# Patient Record
Sex: Female | Born: 1955 | Race: White | Hispanic: No | Marital: Married | Smoking: Never smoker
Health system: Southern US, Community
[De-identification: ages and names within clinical notes are randomized; demographics above are authoritative.]

## PROBLEM LIST (undated history)

## (undated) DIAGNOSIS — T783XXA Angioneurotic edema, initial encounter: Secondary | ICD-10-CM

## (undated) DIAGNOSIS — R51 Headache: Secondary | ICD-10-CM

## (undated) DIAGNOSIS — C801 Malignant (primary) neoplasm, unspecified: Secondary | ICD-10-CM

## (undated) DIAGNOSIS — K5900 Constipation, unspecified: Secondary | ICD-10-CM

## (undated) DIAGNOSIS — R1310 Dysphagia, unspecified: Secondary | ICD-10-CM

## (undated) HISTORY — DX: Angioneurotic edema, initial encounter: T78.3XXA

## (undated) HISTORY — PX: DOPPLER ECHOCARDIOGRAPHY: SHX263

## (undated) HISTORY — DX: Dysphagia, unspecified: R13.10

## (undated) HISTORY — DX: Constipation, unspecified: K59.00

## (undated) HISTORY — PX: COLONOSCOPY: SHX174

---

## 1986-02-17 HISTORY — PX: MASTECTOMY, MODIFIED RADICAL W/RECONSTRUCTION: SHX708

## 1997-02-17 HISTORY — PX: AREOLA/NIPPLE RECONSTRUCTION WITH GRAFT: SHX5586

## 1998-08-28 ENCOUNTER — Other Ambulatory Visit: Admission: RE | Admit: 1998-08-28 | Discharge: 1998-08-28 | Payer: Self-pay | Admitting: Internal Medicine

## 1999-08-28 ENCOUNTER — Other Ambulatory Visit: Admission: RE | Admit: 1999-08-28 | Discharge: 1999-08-28 | Payer: Self-pay | Admitting: Internal Medicine

## 2006-05-01 ENCOUNTER — Encounter: Admission: RE | Admit: 2006-05-01 | Discharge: 2006-05-01 | Payer: Self-pay | Admitting: Internal Medicine

## 2007-05-04 ENCOUNTER — Ambulatory Visit (HOSPITAL_COMMUNITY): Admission: RE | Admit: 2007-05-04 | Discharge: 2007-05-04 | Payer: Self-pay | Admitting: Internal Medicine

## 2007-05-04 ENCOUNTER — Encounter: Payer: Self-pay | Admitting: Internal Medicine

## 2007-05-07 ENCOUNTER — Encounter: Payer: Self-pay | Admitting: Internal Medicine

## 2007-08-12 ENCOUNTER — Ambulatory Visit: Payer: Self-pay | Admitting: Internal Medicine

## 2007-08-12 DIAGNOSIS — J45991 Cough variant asthma: Secondary | ICD-10-CM | POA: Insufficient documentation

## 2007-08-12 DIAGNOSIS — J310 Chronic rhinitis: Secondary | ICD-10-CM | POA: Insufficient documentation

## 2007-08-12 DIAGNOSIS — Z853 Personal history of malignant neoplasm of breast: Secondary | ICD-10-CM

## 2010-11-27 ENCOUNTER — Emergency Department (HOSPITAL_COMMUNITY)
Admission: EM | Admit: 2010-11-27 | Discharge: 2010-11-27 | Disposition: A | Discharge status: Home / Self Care | Payer: Managed Care, Other (non HMO) | Attending: Emergency Medicine | Admitting: Emergency Medicine

## 2010-11-27 ENCOUNTER — Emergency Department (HOSPITAL_COMMUNITY): Payer: Managed Care, Other (non HMO)

## 2010-11-27 DIAGNOSIS — Z853 Personal history of malignant neoplasm of breast: Secondary | ICD-10-CM | POA: Insufficient documentation

## 2010-11-27 DIAGNOSIS — R110 Nausea: Secondary | ICD-10-CM | POA: Insufficient documentation

## 2010-11-27 DIAGNOSIS — R079 Chest pain, unspecified: Secondary | ICD-10-CM | POA: Insufficient documentation

## 2010-11-27 DIAGNOSIS — Z8249 Family history of ischemic heart disease and other diseases of the circulatory system: Secondary | ICD-10-CM | POA: Insufficient documentation

## 2010-11-27 LAB — CBC
HCT: 40.8 % (ref 36.0–46.0)
Hemoglobin: 13.7 g/dL (ref 12.0–15.0)
MCH: 31.9 pg (ref 26.0–34.0)
MCHC: 33.6 g/dL (ref 30.0–36.0)
RBC: 4.3 MIL/uL (ref 3.87–5.11)

## 2010-11-27 LAB — BASIC METABOLIC PANEL
BUN: 7 mg/dL (ref 6–23)
CO2: 25 mEq/L (ref 19–32)
Calcium: 9.7 mg/dL (ref 8.4–10.5)
Glucose, Bld: 99 mg/dL (ref 70–99)

## 2010-11-27 LAB — DIFFERENTIAL
Lymphocytes Relative: 13 % (ref 12–46)
Lymphs Abs: 1 10*3/uL (ref 0.7–4.0)
Monocytes Absolute: 0.7 10*3/uL (ref 0.1–1.0)
Monocytes Relative: 9 % (ref 3–12)
Neutro Abs: 5.8 10*3/uL (ref 1.7–7.7)

## 2010-11-27 LAB — POCT I-STAT TROPONIN I

## 2010-12-06 ENCOUNTER — Observation Stay (HOSPITAL_COMMUNITY)
Admission: EM | Admit: 2010-12-06 | Discharge: 2010-12-07 | Disposition: A | Discharge status: Home / Self Care | Payer: Managed Care, Other (non HMO) | Attending: Internal Medicine | Admitting: Internal Medicine

## 2010-12-06 ENCOUNTER — Emergency Department (HOSPITAL_COMMUNITY): Payer: Managed Care, Other (non HMO)

## 2010-12-06 DIAGNOSIS — Z8249 Family history of ischemic heart disease and other diseases of the circulatory system: Secondary | ICD-10-CM | POA: Insufficient documentation

## 2010-12-06 DIAGNOSIS — Z23 Encounter for immunization: Secondary | ICD-10-CM | POA: Insufficient documentation

## 2010-12-06 DIAGNOSIS — R079 Chest pain, unspecified: Principal | ICD-10-CM | POA: Insufficient documentation

## 2010-12-06 DIAGNOSIS — Z853 Personal history of malignant neoplasm of breast: Secondary | ICD-10-CM | POA: Insufficient documentation

## 2010-12-06 LAB — DIFFERENTIAL
Basophils Absolute: 0 10*3/uL (ref 0.0–0.1)
Basophils Relative: 0 % (ref 0–1)
Lymphocytes Relative: 17 % (ref 12–46)
Monocytes Absolute: 0.8 10*3/uL (ref 0.1–1.0)
Neutro Abs: 4.9 10*3/uL (ref 1.7–7.7)
Neutrophils Relative %: 70 % (ref 43–77)

## 2010-12-06 LAB — PROTIME-INR
INR: 0.91 (ref 0.00–1.49)
Prothrombin Time: 12.4 seconds (ref 11.6–15.2)

## 2010-12-06 LAB — COMPREHENSIVE METABOLIC PANEL
Albumin: 4.1 g/dL (ref 3.5–5.2)
Alkaline Phosphatase: 82 U/L (ref 39–117)
BUN: 5 mg/dL — ABNORMAL LOW (ref 6–23)
Calcium: 10.3 mg/dL (ref 8.4–10.5)
Creatinine, Ser: 0.43 mg/dL — ABNORMAL LOW (ref 0.50–1.10)
GFR calc Af Amer: >90 mL/min (ref >90)
Glucose, Bld: 95 mg/dL (ref 70–99)
Total Protein: 7.7 g/dL (ref 6.0–8.3)

## 2010-12-06 LAB — URINALYSIS, ROUTINE W REFLEX MICROSCOPIC
Bilirubin Urine: NEGATIVE
Nitrite: NEGATIVE
Specific Gravity, Urine: 1.01 (ref 1.005–1.030)
Urobilinogen, UA: 0.2 mg/dL (ref 0.0–1.0)
pH: 6.5 (ref 5.0–8.0)

## 2010-12-06 LAB — APTT: aPTT: 29 seconds (ref 24–37)

## 2010-12-06 LAB — CBC
HCT: 41.6 % (ref 36.0–46.0)
Hemoglobin: 14.4 g/dL (ref 12.0–15.0)
MCHC: 34.6 g/dL (ref 30.0–36.0)
RBC: 4.39 MIL/uL (ref 3.87–5.11)

## 2010-12-06 LAB — POCT I-STAT, CHEM 8
BUN: 3 mg/dL — ABNORMAL LOW (ref 6–23)
Calcium, Ion: 1.2 mmol/L (ref 1.12–1.32)
Chloride: 100 mEq/L (ref 96–112)
Glucose, Bld: 99 mg/dL (ref 70–99)
HCT: 45 % (ref 36.0–46.0)
Potassium: 3.9 mEq/L (ref 3.5–5.1)

## 2010-12-06 LAB — CK TOTAL AND CKMB (NOT AT ARMC)
CK, MB: 1.5 ng/mL (ref 0.3–4.0)
Relative Index: INVALID (ref 0.0–2.5)
Total CK: 67 U/L (ref 7–177)

## 2010-12-06 LAB — HEMOGLOBIN A1C: Hgb A1c MFr Bld: 5.2 % (ref <5.7)

## 2010-12-06 LAB — URINE MICROSCOPIC-ADD ON

## 2010-12-06 LAB — POCT I-STAT TROPONIN I: Troponin i, poc: 0 ng/mL (ref 0.00–0.08)

## 2010-12-06 LAB — MAGNESIUM: Magnesium: 1.7 mg/dL (ref 1.5–2.5)

## 2010-12-07 ENCOUNTER — Observation Stay (HOSPITAL_COMMUNITY): Payer: Managed Care, Other (non HMO)

## 2010-12-07 HISTORY — PX: NM MYOCAR MULTIPLE W/SPECT: HXRAD626

## 2010-12-07 LAB — CBC
MCH: 31.7 pg (ref 26.0–34.0)
MCV: 95.9 fL (ref 78.0–100.0)
Platelets: 222 10*3/uL (ref 150–400)
RBC: 3.88 MIL/uL (ref 3.87–5.11)
RDW: 12.5 % (ref 11.5–15.5)
WBC: 5.7 10*3/uL (ref 4.0–10.5)

## 2010-12-07 LAB — CARDIAC PANEL(CRET KIN+CKTOT+MB+TROPI)
CK, MB: 1.5 ng/mL (ref 0.3–4.0)
Relative Index: INVALID (ref 0.0–2.5)
Total CK: 46 U/L (ref 7–177)
Total CK: 40 U/L (ref 7–177)
Troponin I: <0.3 ng/mL (ref <0.30)

## 2010-12-07 LAB — LIPID PANEL
HDL: 74 mg/dL (ref >39)
LDL Cholesterol: 81 mg/dL (ref 0–99)
Total CHOL/HDL Ratio: 2.2 RATIO

## 2010-12-07 MED ORDER — TECHNETIUM TC 99M TETROFOSMIN IV KIT
30.0000 | PACK | Freq: Once | INTRAVENOUS | Status: AC | PRN
  Administered 2010-12-07: 30 via INTRAVENOUS

## 2010-12-07 MED ORDER — TECHNETIUM TC 99M TETROFOSMIN IV KIT
10.0000 | PACK | Freq: Once | INTRAVENOUS | Status: AC | PRN
  Administered 2010-12-07: 10 via INTRAVENOUS

## 2010-12-10 NOTE — Discharge Summary (Signed)
Emily Holder, Emily Holder               ACCOUNT NO.:  [id number]  MEDICAL RECORD NO.:  [id number]  LOCATION:  2021                         FACILITY:  [facility]  PHYSICIAN:  Italy Hilty, MD         DATE OF BIRTH:  01-06-1956  DATE OF ADMISSION:  12/06/2010 DATE OF DISCHARGE:  12/07/2010                              DISCHARGE SUMMARY   DISCHARGE DIAGNOSES: 1. Chest pain, negative myocardial infarction.     a.     Negative Lexiscan Myoview.     b.     Suspect acute pericarditis. 2. Premature family history of coronary artery disease. 3. History of breast cancer with reconstruction 24 years ago.  DISCHARGE CONDITION:  Improved.  PROCEDURES:  None.  DISCHARGE MEDICATIONS:  See medication reconciliation sheet, though we did add ibuprofen 600 mg t.i.d. for 3 days and then as needed, also we placed her on Protonix 40 mg daily in addition to her previous medicines.  DISCHARGE INSTRUCTIONS:  She will follow up with Dr. Rennis Golden in 1-2 weeks, the office will call with date and time.  HOSPITAL COURSE:  A 55 year old white married female who presented to [facility] Emergency Room secondary to chest pain at the request of her primary physician, Dr. Felipa Eth.  The patient had previously been seen in the emergency room a week prior, and cardiac enzymes were negative.  The pain had gradually resolved during the ER visit.  She was discharged, but then the pain returned on the day of admission.  She was going to be seen in our office, but the pain continued, it was significant, she came to the emergency room for further evaluation.  I kept her awake all night.  She cannot lie flat.  She would have to sit up and it would increase in discomfort with a deep breath.  Her EKG revealed no acute changes and she was admitted to telemetry bed for further evaluation. Please see dictated H and P for complete details.  Overnight with Toradol and Protonix, her pain resolved and she had no further chest pain.  On the  next morning, she underwent a nuclear stress test. Lexiscan Myoview which was negative for ischemia and an EF was 68%.  Dr. Royann Shivers, saw her the day of discharge, cardiac enzymes were negative and her EKG had changed.  She had new ST elevation in leads II, III, and aVF.  There was a one time EKG when she came down for the nuclear stress test, the EKG was similar to the admission EKG.  A 2D echo was also done.  EF was 60-65%.  There was no regional wall motion abnormalities.  She had some mild left ventricular abnormal relaxation with grade 1 diastolic dysfunction, a trivial pericardial effusion was identified, but no signs of tamponade because the echo was normal and the patient had no further chest pain, and negative nuclear stress test.  She was discharged home and will follow up as an outpatient.  LABORATORY DATA:  Hemoglobin at discharge 12.3, hematocrit 37.2, WBC 5.7, platelets 222.  On admission, neutrophils were 17, mono 11, and eosinophils 2.  Protime on admission was 12.4 with INR 0.91, PTT 29.  Chemistry,  sodium 139, potassium 3.9, chloride 100, CO2 of 28, glucose 99, BUN 3, creatinine 0.43, total protein 7.7, albumin 4.1, AST 33, ALT 17, alkaline phos 82, total bili 0.7, magnesium 1.7.  Glycohemoglobin was 12.2.  Cardiac enzymes were negative with CKs ranging 67-40, MBs 1.5 x3 and troponin I less than 0.30 x3.  Total cholesterol 164, triglycerides 45, HDL 74.  LDL was 81.  TSH 1.894.  UA did have leukocyte esterase.  Chest x-ray was done.  No acute cardiopulmonary findings.  The patient was discharged home as instructed and will follow up as an outpatient.     Darcella Gasman. Annie Paras, N.P.   ______________________________ [place] Hilty, MD    LRI/MEDQ  D:  12/08/2010  T:  12/09/2010  Job:  [id number]  cc:   Larina Earthly, M.D.  Electronically Signed by Nada Boozer N.P. on 12/09/2010 01:33:41 PM Electronically Signed by Kirtland Bouchard. HILTY M.D. on 12/10/2010 08:23:06 AM

## 2010-12-10 NOTE — H&P (Signed)
Emily Holder, Emily Holder               ACCOUNT NO.:  [id number]  MEDICAL RECORD NO.:  [id number]  LOCATION:  [facility]                         FACILITY:  [facility]  PHYSICIAN:  Italy Hilty, MD         DATE OF BIRTH:  1955/12/20  DATE OF ADMISSION:  12/06/2010 DATE OF DISCHARGE:                             HISTORY & PHYSICAL   CHIEF COMPLAINT:  Chest pain.  HISTORY OF PRESENT ILLNESS:  A 55 year old white married female with premature family history of coronary artery disease in her family history.  Her father died at 32 with an MI.  He also had rheumatoid arthritis.  The patient has had now 2 ER visits within the last 10 days. Initially on November 27, 2010, she developed left arm aching that eventually settled into her chest.  It lasted for awhile, resolved, and then came back stronger, so she went to the Emergency Room at [facility].  There she was in the ER for 5 hours.  EKG was stable.  Troponin-I was done once every 3 hours, which was negative.  Other labs were normal.  Chest x-ray was without acute process and she actually started feeling better.  She has done fairly well until yesterday.  Last night, she started having left arm aching again, then it gradually went into her chest, she took some Tylenol PM and tried to go to sleep, unable to rest, she took an aspirin, the discomfort eased some, but it did keep her awake most of the night.  This morning at 6, she was more concerned, it is increased with a deep breath.  She called Dr. Felipa Eth, her primary care, who was going to have her seen at [facility] with Dr. Tresa Endo, but her pain continued and therefore she was asked by Dr. Felipa Eth to come to the emergency room where we are evaluating her.  Currently, she still has discomfort 2-3 on a 1-10 scale and aching discomfort in her left arm, in her chest.  She has not done any exertional exercise that would bring this on.  She does have a history of left mastectomy with reconstruction surgery.   EKG here in the ER is sinus rhythm with no changes from November 27, 2010.  PAST MEDICAL HISTORY:  Breast cancer with left mastectomy and reconstruction 24 years ago.  History of migraines, but they are not very frequent since she has medication to take.  Never used tobacco.  FAMILY HISTORY:  Father died with an MI at age 39.  He also had rheumatoid arthritis, was wheelchair bound.  Mother died with pneumonia, she was diabetic, she has 2 brothers.  No known coronary artery disease that she is aware of.  OUTPATIENT MEDICATIONS:  None except p.r.n. aspirin and her migraine medication.  ALLERGIES:  No known allergies.  SOCIAL HISTORY:  Married.  No tobacco use.  No children.  She is menopausal.  She works at [employer] and does not feel overly stressed to her job.  She has exercised, but she is much more relaxed about that now than a year ago.  REVIEW OF SYSTEMS:  GENERAL:  No colds or fevers.  SKIN:  No rashes. HEENT:  No  blurred vision.  GI:  No diarrhea, constipation, or melena. Some GI discomfort periodically.  She had taken some PPI over-the- counter.  GU:  No hematuria.  GYN:  Went through menopause in her 56s. Left mastectomy with reconstruction 24 years ago.  NEUROLOGIC:  No lightheadedness, dizziness, no syncope.  MUSCULOSKELETAL:  Negative.  PHYSICAL EXAMINATION:  VITAL SIGNS:  Blood pressure 133/94, pulse 100, respiratory rate 18, temp 98.7, oxygen saturation on room air 97%. GENERAL:  Alert and oriented white female, mild distress, somewhat anxious, but no acute illness. SKIN:  Warm and dry.  Brisk capillary refill. HEENT:  Normocephalic.  Sclerae clear. NECK:  Supple.  No JVD. HEART:  Regular rate and rhythm.  S1 and S2. LUNGS:  Clear, but she does have pain on palpation of her chest and with deep inspiration. ABDOMEN:  Soft, nontender.  Positive bowel sounds. EXTREMITIES:  2+ pedal pulses bilaterally.  No edema.  Feet are cold. NEUROLOGIC:  Alert and oriented x3.   Moves all extremities.  Follows commands.  LABORATORY DATA:  Pending.  EKG, sinus rhythm, no acute changes from November 27, 2010.  Portable chest x-ray is pending.  IMPRESSION: 1. Recurrent chest pain.  Rule out myocardial infarction. 2. Premature family history of coronary artery disease. 3. History of breast cancer with mastectomy and reconstruction.  PLAN:  Dr. Rennis Golden has seen and evaluated.  We will bring her into the hospital for observation, rule out MI with serial CK-MBs.  We will add Lovenox 1 mg/kg q.12, and plan for a Lexiscan Myoview on December 07, 2010, and we will proceed from that point.  We will go ahead and give her GI cocktail now, begin Protonix 40 mg daily now, and started on low- dose beta-blocker with heart rate of 100.  Also, add Toradol 30 mg IV now and in 6 hours, then 6 hours p.r.n. for severe pain Morphine, she also has nitroglycerin sublingual ordered.     Darcella Gasman. Annie Paras, N.P.   ______________________________ [place] Hilty, MD    LRI/MEDQ  D:  12/06/2010  T:  12/06/2010  Job:  [id number]  Electronically Signed by Nada Boozer N.P. on 12/08/2010 05:21:09 PM Electronically Signed by Kirtland Bouchard. HILTY M.D. on 12/10/2010 08:23:02 AM

## 2010-12-25 ENCOUNTER — Other Ambulatory Visit: Payer: Self-pay | Admitting: Internal Medicine

## 2010-12-25 DIAGNOSIS — Z853 Personal history of malignant neoplasm of breast: Secondary | ICD-10-CM

## 2010-12-25 DIAGNOSIS — I319 Disease of pericardium, unspecified: Secondary | ICD-10-CM

## 2010-12-27 ENCOUNTER — Ambulatory Visit
Admission: RE | Admit: 2010-12-27 | Discharge: 2010-12-27 | Disposition: A | Discharge status: Home / Self Care | Payer: Managed Care, Other (non HMO) | Source: Ambulatory Visit | Attending: Internal Medicine | Admitting: Internal Medicine

## 2010-12-27 DIAGNOSIS — I319 Disease of pericardium, unspecified: Secondary | ICD-10-CM

## 2010-12-27 DIAGNOSIS — Z853 Personal history of malignant neoplasm of breast: Secondary | ICD-10-CM

## 2012-09-09 IMAGING — CR DG CHEST 2V
2 series · 2 of 2 positions shown · non-contrast
Comparison: None

CLINICAL DATA: Chest pain

CHEST - 2 VIEW

[w chest pa]
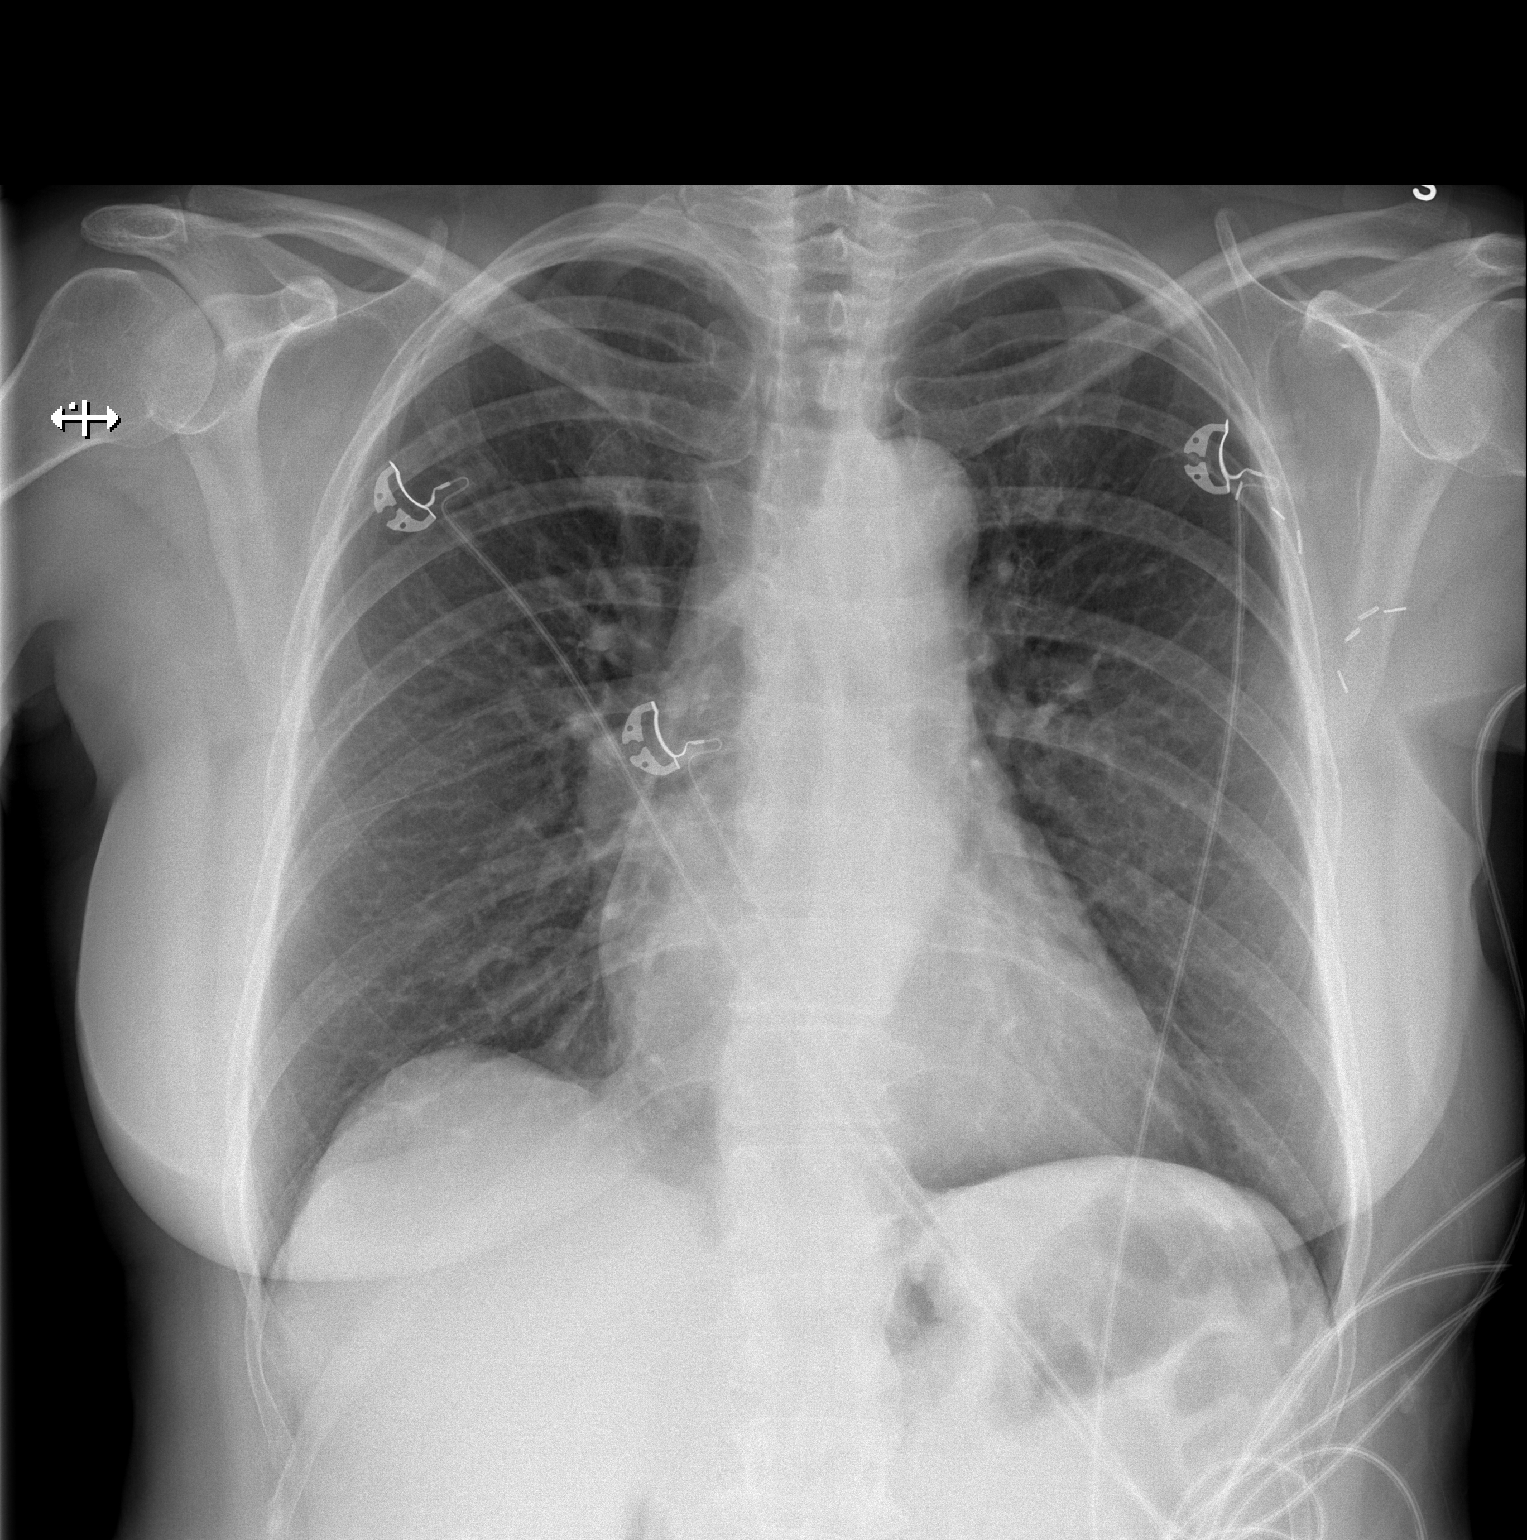

[w chest lat]
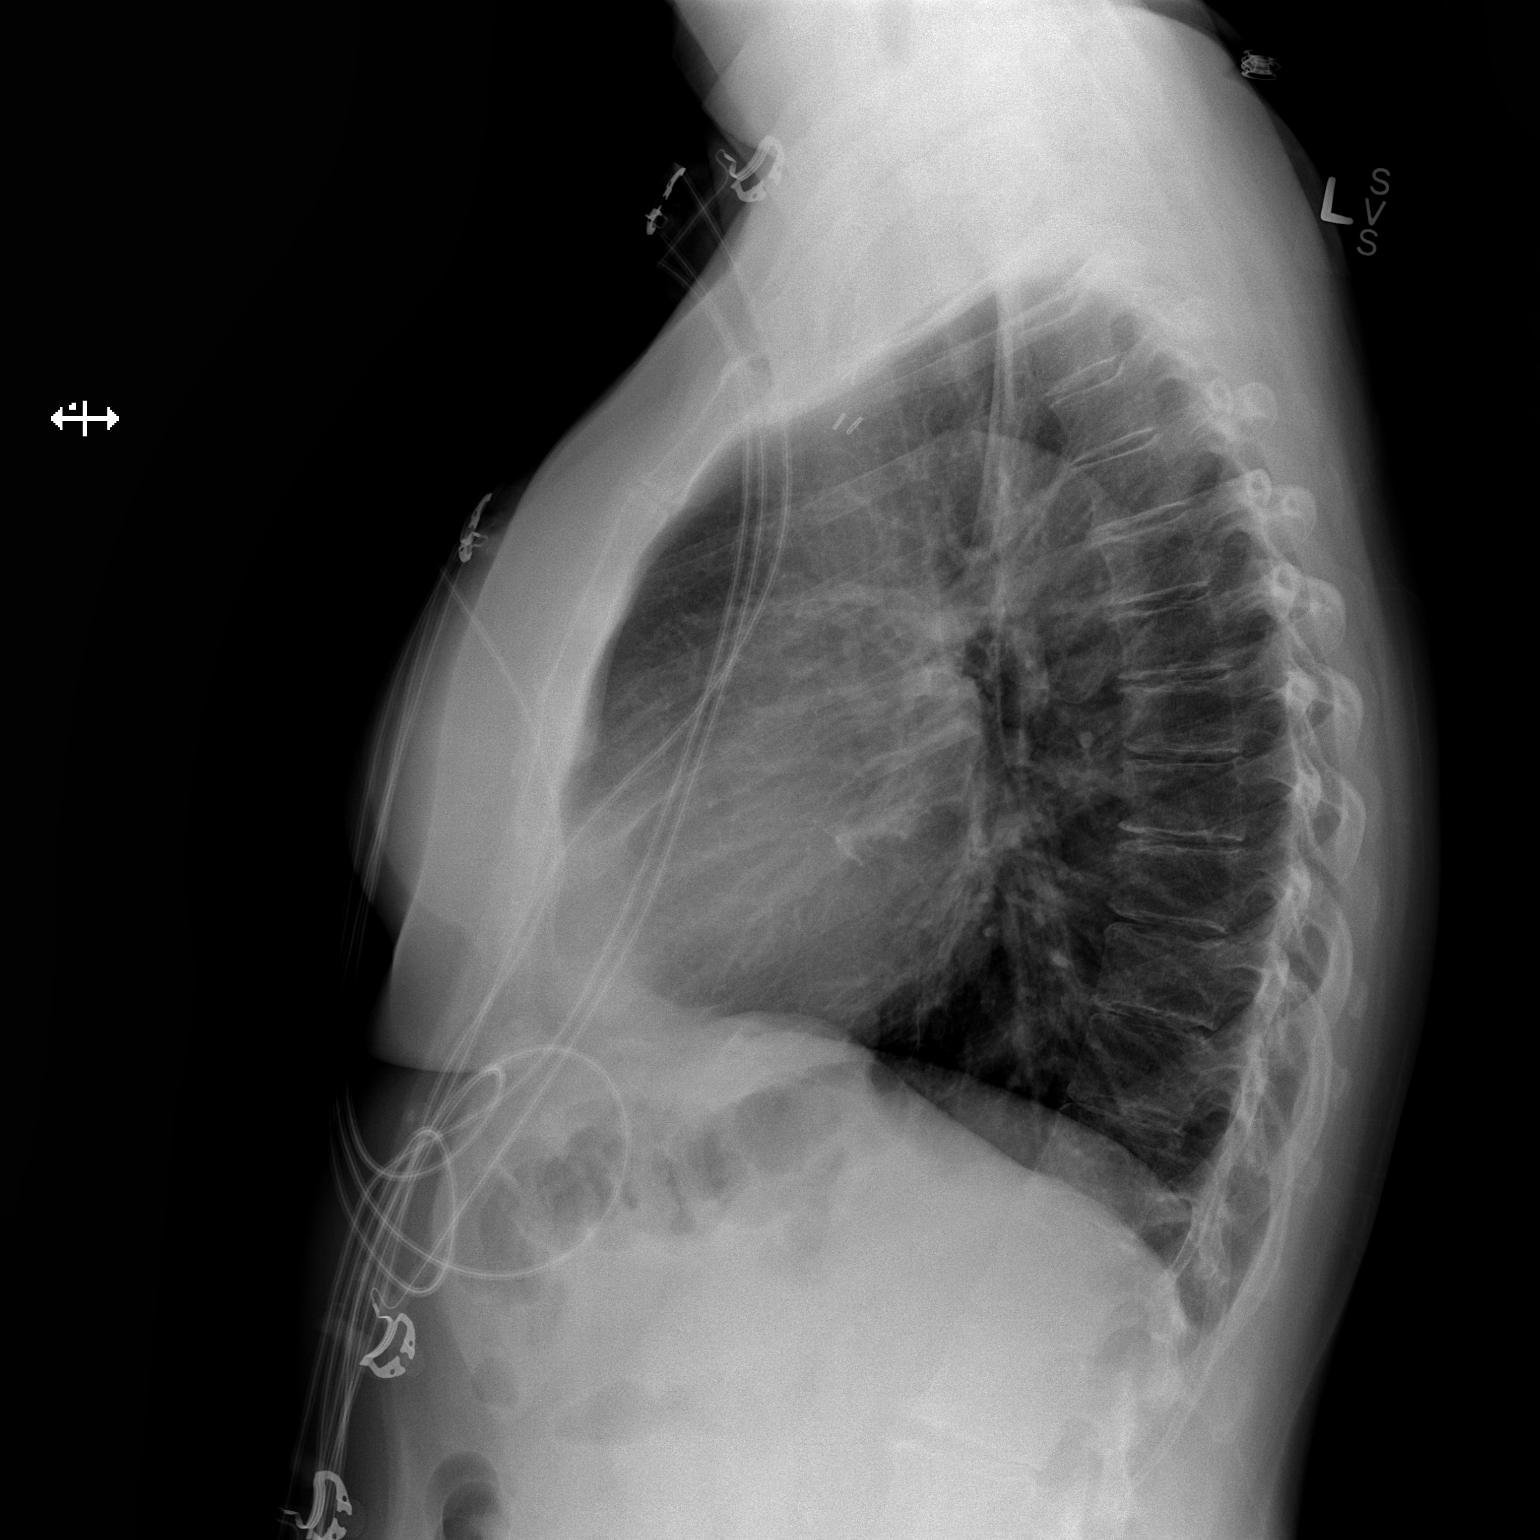

[2 of 2 positions shown; findings below may reference images not displayed]

FINDINGS: Artifact overlies chest.  Heart size is normal.
Mediastinal shadows are normal.  The lungs are clear.  Previous
left mastectomy and reconstruction.  No significant bony finding.
IMPRESSION: No active disease

## 2012-09-18 IMAGING — CR DG CHEST 1V PORT
1 series · 1 of 1 positions shown · non-contrast
Comparison: Chest x-ray 11/27/2010.

CLINICAL DATA: Chest pain.

PORTABLE CHEST - 1 VIEW

[AP]
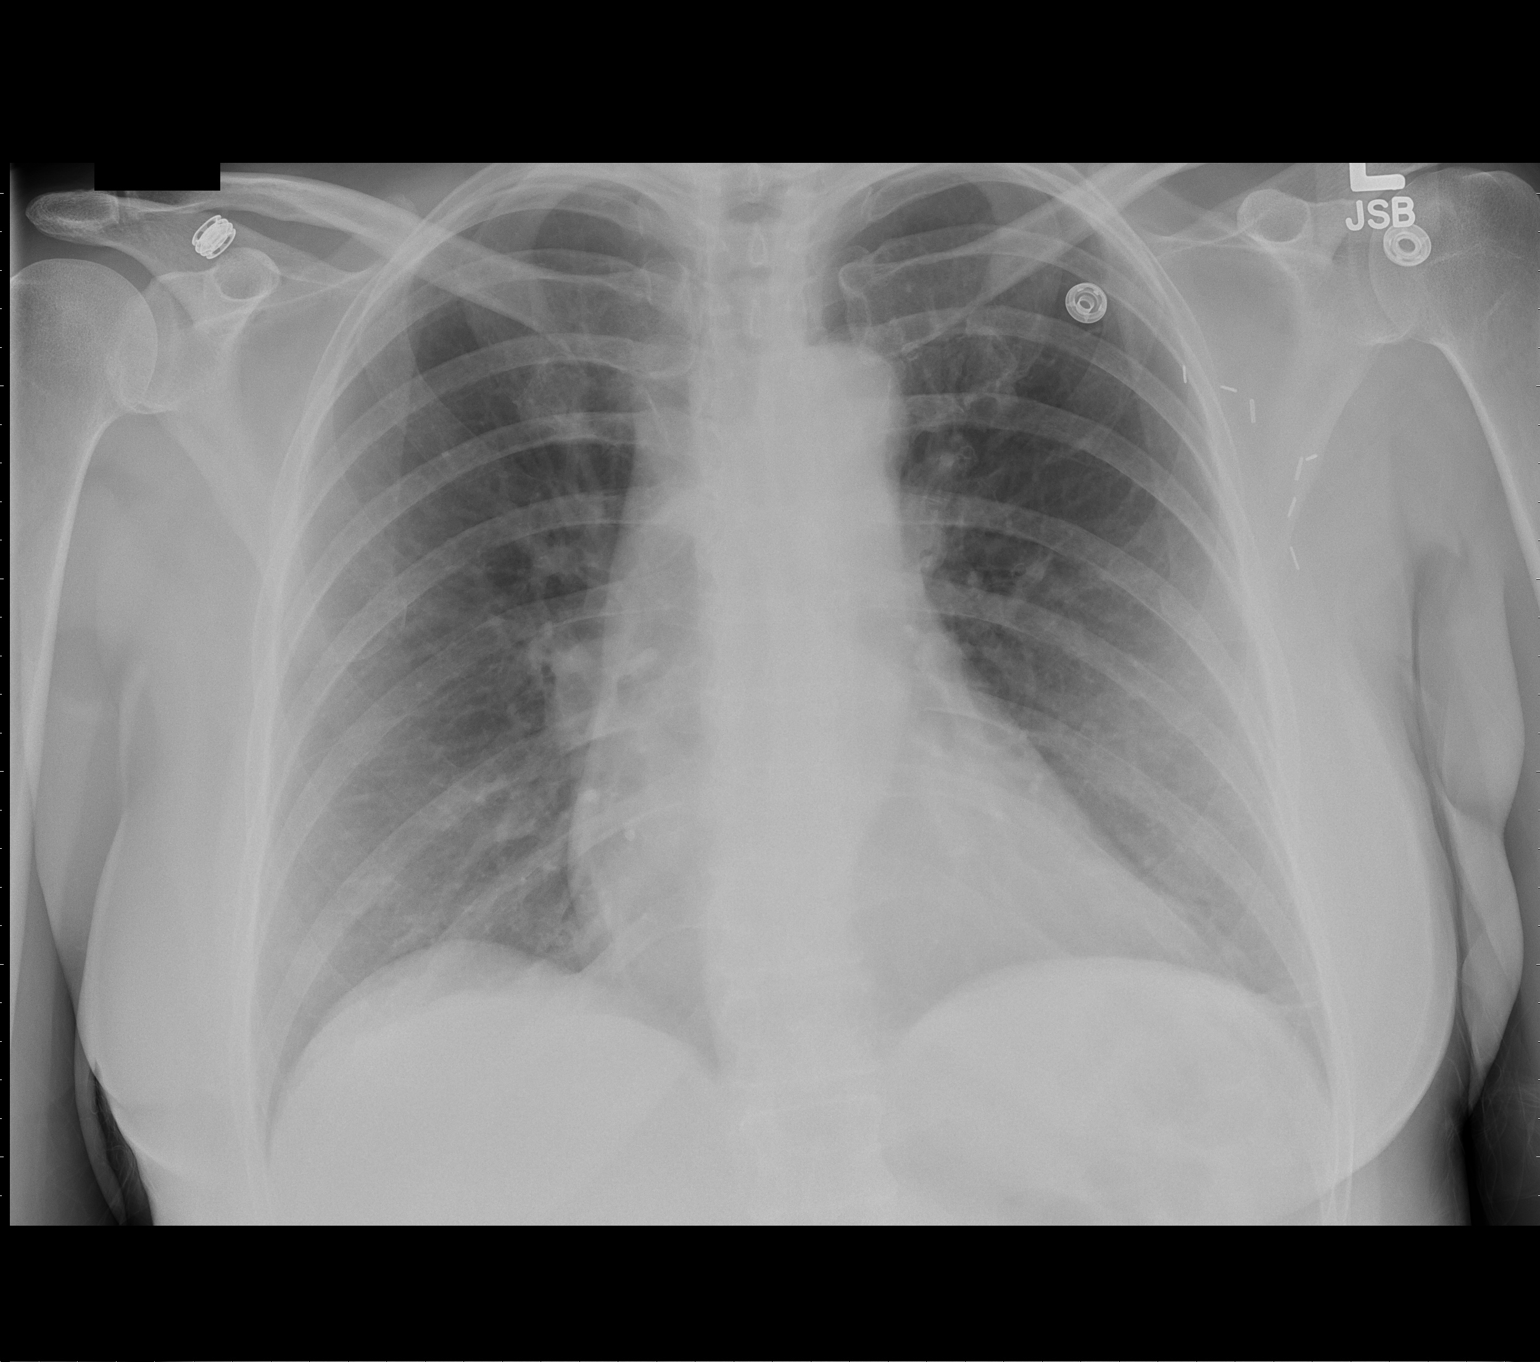

[1 of 1 positions shown; findings below may reference images not displayed]

FINDINGS: The cardiac silhouette, mediastinal and hilar contours
are within normal limits.  The lungs are clear.  No pleural
effusion.  The bony thorax is intact.
IMPRESSION: No acute cardiopulmonary findings.

## 2012-10-09 IMAGING — CT CT CHEST W/O CM
3 of 4 series · 17 of 30 positions shown, 19 images · non-contrast
Comparison: Chest x-ray 12/06/2010

CLINICAL DATA: Recent pericarditis.  History of breast cancer.

CT CHEST WITHOUT CONTRAST
TECHNIQUE: Multidetector CT imaging of the chest was performed
following the standard protocol without IV contrast.

[Series 3: chest w/o · axial · non-contrast · 0.66mm/px · z∈[-206,-26]mm · 4 of 60 slices shown]
[im 12/60  lung]
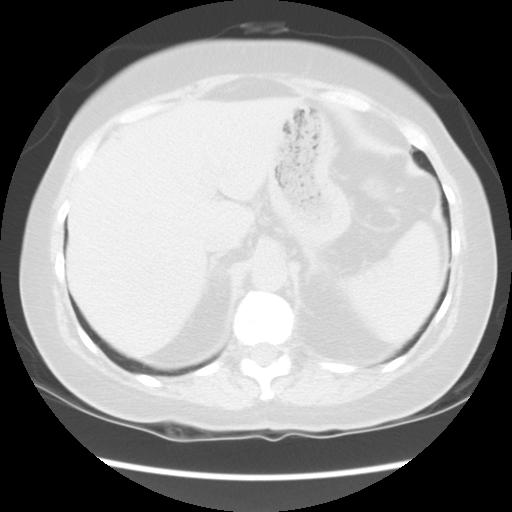
[im 24/60  lung]
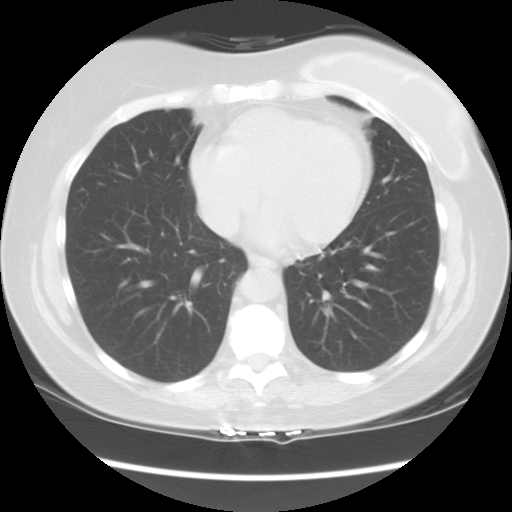
[im 36/60  lung]
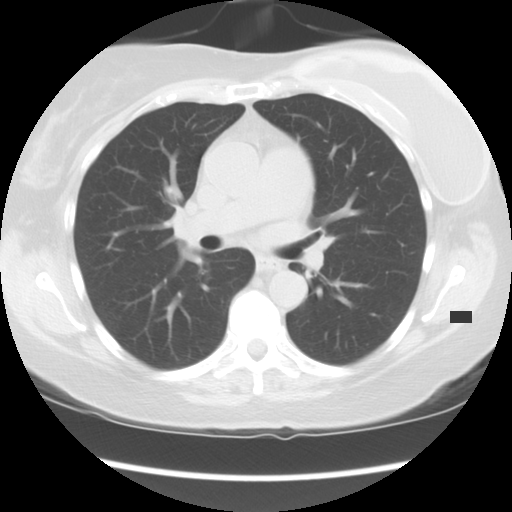
[im 48/60  lung]
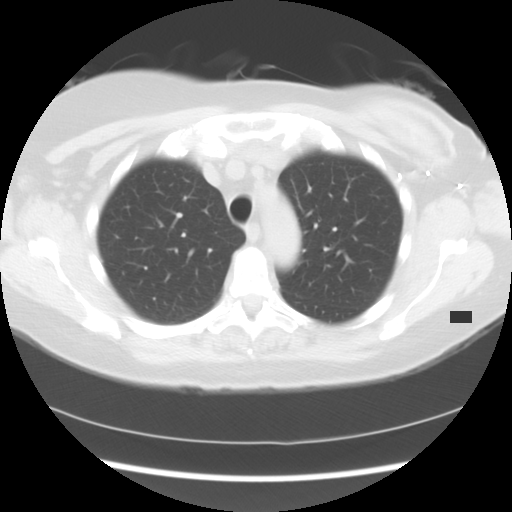

[Series 4: lung windows · axial · 0.66mm/px · z∈[-216,-16]mm · 5 of 60 slices shown, 7 images]
[im 10/60  mediastinal]
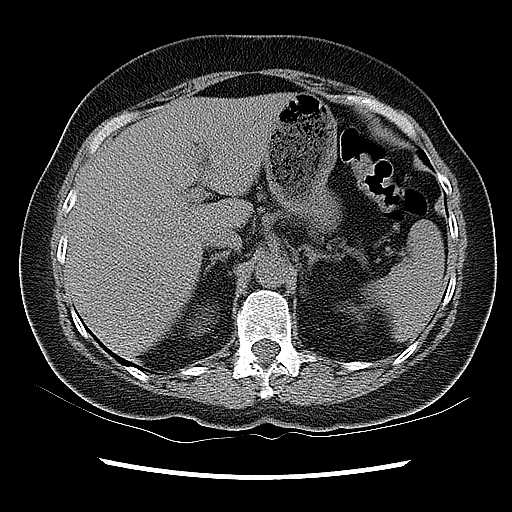
[im 10/60  lung]
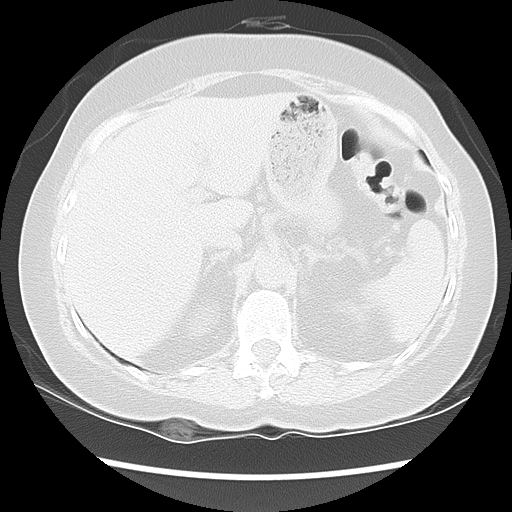
[im 20/60  lung]
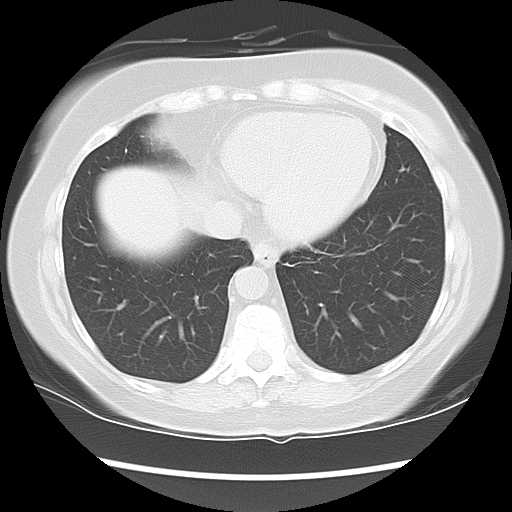
[im 30/60  lung]
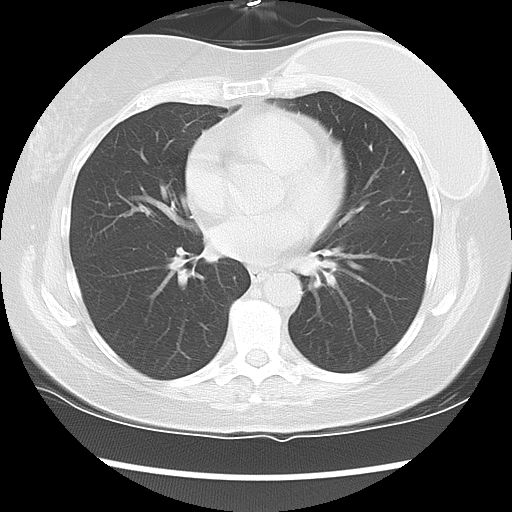
[im 40/60  lung]
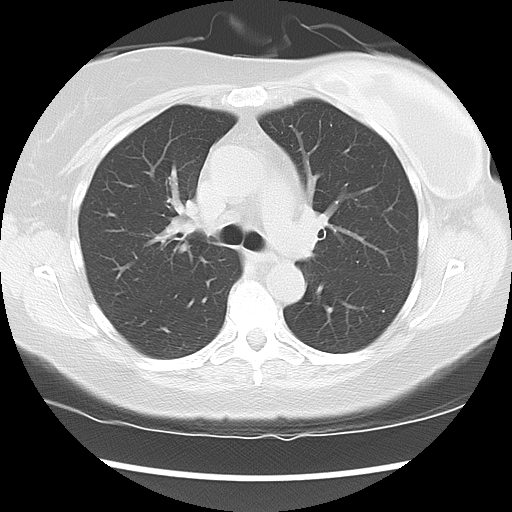
[im 50/60  mediastinal]
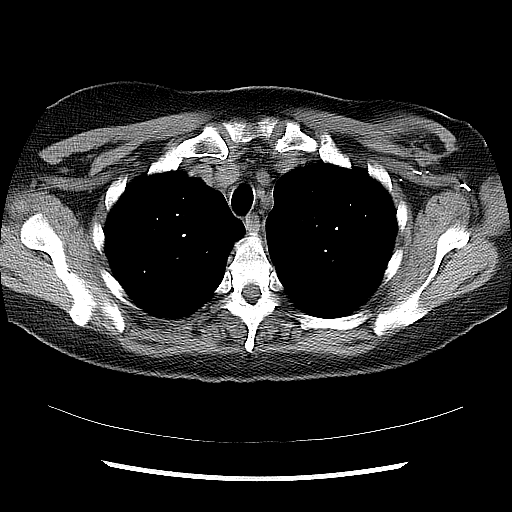
[im 50/60  lung]
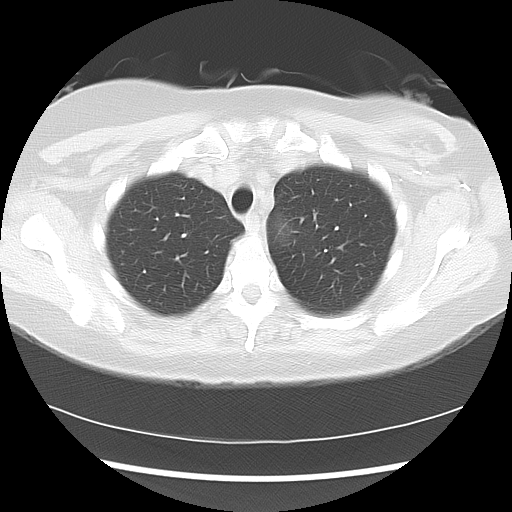

[Series 602: sagittal body · sagittal · 0.66mm/px · 8 of 136 slices shown]
[im 10/136  mediastinal]
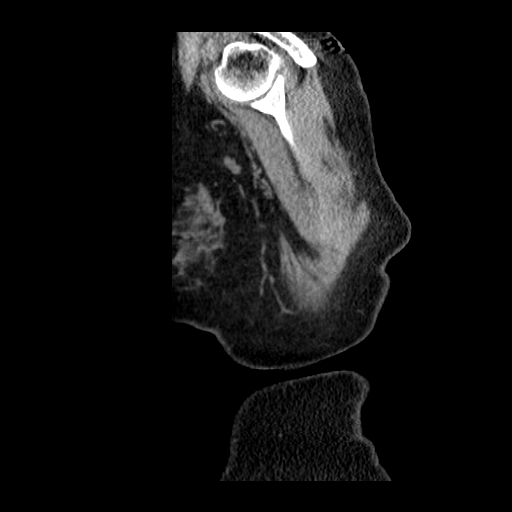
[im 29/136  mediastinal]
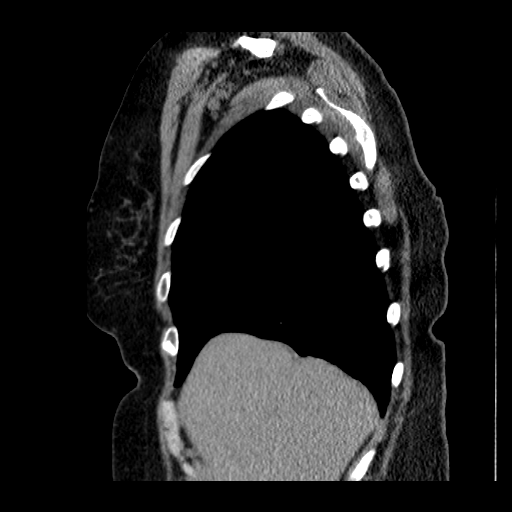
[im 49/136  mediastinal]
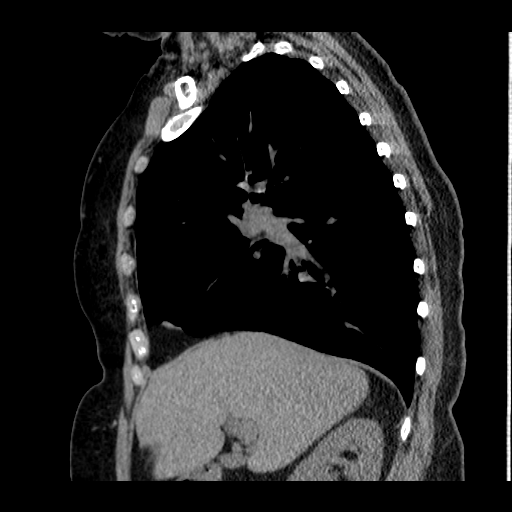
[im 58/136  mediastinal]
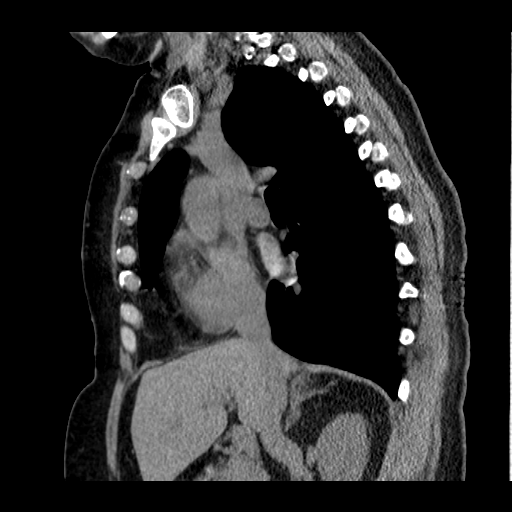
[im 78/136  mediastinal]
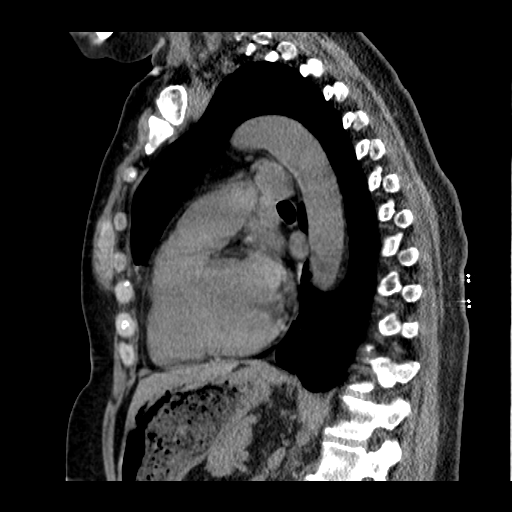
[im 87/136  mediastinal]
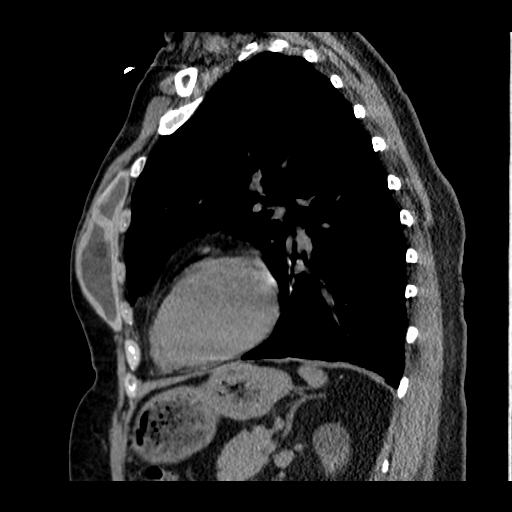
[im 107/136  mediastinal]
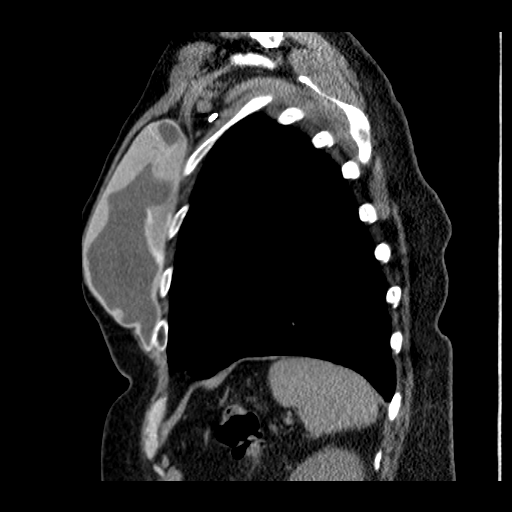
[im 126/136  mediastinal]
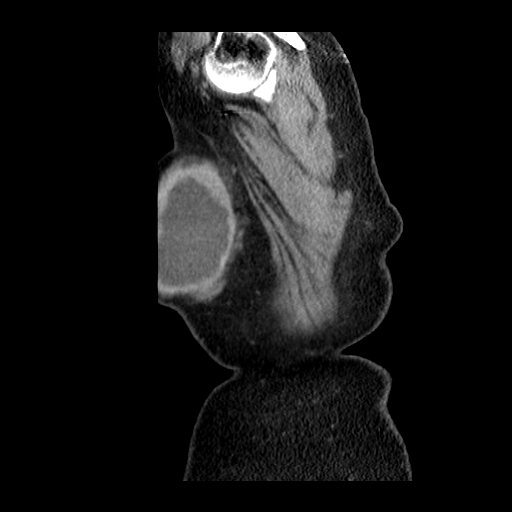

[17 of 30 positions shown; findings below may reference images not displayed]

FINDINGS: Changes of prior left mastectomy with left breast
implant.  Evidence of left axillary nodal dissection.  No
mediastinal or hilar adenopathy. Heart is normal size. Aorta is
normal caliber.  Lungs are clear.  No focal airspace opacities or
suspicious nodules.  No effusions. Imaging into the upper abdomen
shows no acute findings.  2.3 cm low density area within the upper
pole of the left kidney, likely a cyst although this cannot be
fully characterized without IV contrast.  No acute or focal bony
abnormality.
IMPRESSION: Prior left mastectomy and lymph axillary node dissection.

No acute findings in the chest.

## 2013-07-19 ENCOUNTER — Other Ambulatory Visit: Payer: Self-pay | Admitting: Plastic Surgery

## 2013-07-19 ENCOUNTER — Encounter: Payer: Self-pay | Admitting: Plastic Surgery

## 2013-07-19 DIAGNOSIS — N651 Disproportion of reconstructed breast: Secondary | ICD-10-CM

## 2013-07-19 DIAGNOSIS — T8543XA Leakage of breast prosthesis and implant, initial encounter: Secondary | ICD-10-CM

## 2013-07-22 ENCOUNTER — Encounter (HOSPITAL_BASED_OUTPATIENT_CLINIC_OR_DEPARTMENT_OTHER): Payer: Self-pay | Admitting: *Deleted

## 2013-07-22 NOTE — Progress Notes (Signed)
No labs needed

## 2013-07-28 ENCOUNTER — Ambulatory Visit (HOSPITAL_BASED_OUTPATIENT_CLINIC_OR_DEPARTMENT_OTHER): Payer: Managed Care, Other (non HMO) | Admitting: Anesthesiology

## 2013-07-28 ENCOUNTER — Encounter (HOSPITAL_BASED_OUTPATIENT_CLINIC_OR_DEPARTMENT_OTHER): Payer: Managed Care, Other (non HMO) | Admitting: Anesthesiology

## 2013-07-28 ENCOUNTER — Encounter (HOSPITAL_BASED_OUTPATIENT_CLINIC_OR_DEPARTMENT_OTHER): Payer: Self-pay | Admitting: *Deleted

## 2013-07-28 ENCOUNTER — Ambulatory Visit (HOSPITAL_BASED_OUTPATIENT_CLINIC_OR_DEPARTMENT_OTHER)
Admission: RE | Admit: 2013-07-28 | Discharge: 2013-07-28 | Disposition: A | Discharge status: Home / Self Care | Payer: Managed Care, Other (non HMO) | Source: Ambulatory Visit | Attending: Plastic Surgery | Admitting: Plastic Surgery

## 2013-07-28 ENCOUNTER — Encounter (HOSPITAL_BASED_OUTPATIENT_CLINIC_OR_DEPARTMENT_OTHER)
Admission: RE | Disposition: A | Discharge status: Home / Self Care | Payer: Self-pay | Source: Ambulatory Visit | Attending: Plastic Surgery

## 2013-07-28 DIAGNOSIS — N651 Disproportion of reconstructed breast: Secondary | ICD-10-CM

## 2013-07-28 DIAGNOSIS — Y838 Other surgical procedures as the cause of abnormal reaction of the patient, or of later complication, without mention of misadventure at the time of the procedure: Secondary | ICD-10-CM | POA: Insufficient documentation

## 2013-07-28 DIAGNOSIS — Z9010 Acquired absence of unspecified breast and nipple: Secondary | ICD-10-CM | POA: Insufficient documentation

## 2013-07-28 DIAGNOSIS — T8549XA Other mechanical complication of breast prosthesis and implant, initial encounter: Secondary | ICD-10-CM | POA: Insufficient documentation

## 2013-07-28 DIAGNOSIS — Z79899 Other long term (current) drug therapy: Secondary | ICD-10-CM | POA: Insufficient documentation

## 2013-07-28 DIAGNOSIS — T8543XA Leakage of breast prosthesis and implant, initial encounter: Secondary | ICD-10-CM | POA: Diagnosis present

## 2013-07-28 DIAGNOSIS — Z853 Personal history of malignant neoplasm of breast: Secondary | ICD-10-CM | POA: Insufficient documentation

## 2013-07-28 HISTORY — PX: MASTOPEXY: SHX5358

## 2013-07-28 HISTORY — PX: BREAST RECONSTRUCTION WITH PLACEMENT OF TISSUE EXPANDER AND FLEX HD (ACELLULAR HYDRATED DERMIS): SHX6295

## 2013-07-28 HISTORY — DX: Headache: R51

## 2013-07-28 HISTORY — DX: Malignant (primary) neoplasm, unspecified: C80.1

## 2013-07-28 LAB — POCT HEMOGLOBIN-HEMACUE: HEMOGLOBIN: 14.1 g/dL (ref 12.0–15.0)

## 2013-07-28 SURGERY — BREAST RECONSTRUCTION WITH PLACEMENT OF TISSUE EXPANDER AND FLEX HD (ACELLULAR HYDRATED DERMIS)
Anesthesia: General | Site: Breast | Laterality: Right

## 2013-07-28 MED ORDER — MIDAZOLAM HCL 5 MG/5ML IJ SOLN
INTRAMUSCULAR | Status: DC | PRN
  Administered 2013-07-28: 2 mg via INTRAVENOUS

## 2013-07-28 MED ORDER — DEXAMETHASONE SODIUM PHOSPHATE 4 MG/ML IJ SOLN
INTRAMUSCULAR | Status: DC | PRN
  Administered 2013-07-28: 10 mg via INTRAVENOUS

## 2013-07-28 MED ORDER — BUPIVACAINE-EPINEPHRINE (PF) 0.25% -1:200000 IJ SOLN
INTRAMUSCULAR | Status: AC
  Filled 2013-07-28: qty 60

## 2013-07-28 MED ORDER — ONDANSETRON HCL 4 MG/2ML IJ SOLN: 4.0000 mg | Freq: Once | INTRAMUSCULAR | Status: DC | PRN

## 2013-07-28 MED ORDER — SCOPOLAMINE 1 MG/3DAYS TD PT72
1.0000 | MEDICATED_PATCH | TRANSDERMAL | Status: DC
  Administered 2013-07-28: 1.5 mg via TRANSDERMAL

## 2013-07-28 MED ORDER — SUFENTANIL CITRATE 50 MCG/ML IV SOLN
INTRAVENOUS | Status: AC
  Filled 2013-07-28: qty 1

## 2013-07-28 MED ORDER — LIDOCAINE HCL (CARDIAC) 20 MG/ML IV SOLN
INTRAVENOUS | Status: DC | PRN
  Administered 2013-07-28: 50 mg via INTRAVENOUS

## 2013-07-28 MED ORDER — FENTANYL CITRATE 0.05 MG/ML IJ SOLN
INTRAMUSCULAR | Status: AC
  Filled 2013-07-28: qty 6

## 2013-07-28 MED ORDER — HYDROMORPHONE HCL PF 1 MG/ML IJ SOLN
0.2500 mg | INTRAMUSCULAR | Status: DC | PRN
  Administered 2013-07-28 (×4): 0.5 mg via INTRAVENOUS

## 2013-07-28 MED ORDER — BUPIVACAINE-EPINEPHRINE 0.25% -1:200000 IJ SOLN
INTRAMUSCULAR | Status: DC | PRN
  Administered 2013-07-28: 5 mL

## 2013-07-28 MED ORDER — SCOPOLAMINE 1 MG/3DAYS TD PT72: 1.0000 | MEDICATED_PATCH | TRANSDERMAL | Status: DC

## 2013-07-28 MED ORDER — OXYCODONE HCL 5 MG PO TABS
ORAL_TABLET | ORAL | Status: AC
  Filled 2013-07-28: qty 1

## 2013-07-28 MED ORDER — FENTANYL CITRATE 0.05 MG/ML IJ SOLN
INTRAMUSCULAR | Status: DC | PRN
  Administered 2013-07-28: 100 ug via INTRAVENOUS
  Administered 2013-07-28: 50 ug via INTRAVENOUS
  Administered 2013-07-28: 100 ug via INTRAVENOUS
  Administered 2013-07-28: 50 ug via INTRAVENOUS

## 2013-07-28 MED ORDER — CEFAZOLIN SODIUM-DEXTROSE 2-3 GM-% IV SOLR
2.0000 g | INTRAVENOUS | Status: AC
  Administered 2013-07-28: 2 g via INTRAVENOUS

## 2013-07-28 MED ORDER — SODIUM CHLORIDE 0.9 % IJ SOLN
INTRAMUSCULAR | Status: AC
  Filled 2013-07-28: qty 50

## 2013-07-28 MED ORDER — EPHEDRINE SULFATE 50 MG/ML IJ SOLN
INTRAMUSCULAR | Status: DC | PRN
  Administered 2013-07-28: 15 mg via INTRAVENOUS
  Administered 2013-07-28: 10 mg via INTRAVENOUS

## 2013-07-28 MED ORDER — FENTANYL CITRATE 0.05 MG/ML IJ SOLN: 50.0000 ug | INTRAMUSCULAR | Status: DC | PRN

## 2013-07-28 MED ORDER — MIDAZOLAM HCL 2 MG/2ML IJ SOLN: 1.0000 mg | INTRAMUSCULAR | Status: DC | PRN

## 2013-07-28 MED ORDER — LIDOCAINE-EPINEPHRINE 1 %-1:100000 IJ SOLN
INTRAMUSCULAR | Status: AC
  Filled 2013-07-28: qty 1

## 2013-07-28 MED ORDER — SODIUM CHLORIDE 0.9 % IJ SOLN
INTRAMUSCULAR | Status: DC | PRN
  Administered 2013-07-28: 5 mL via INTRAVENOUS

## 2013-07-28 MED ORDER — OXYCODONE HCL 5 MG/5ML PO SOLN: 5.0000 mg | Freq: Once | ORAL | Status: AC | PRN

## 2013-07-28 MED ORDER — CEFAZOLIN SODIUM-DEXTROSE 2-3 GM-% IV SOLR
INTRAVENOUS | Status: AC
  Filled 2013-07-28: qty 50

## 2013-07-28 MED ORDER — HYDROMORPHONE HCL PF 1 MG/ML IJ SOLN
INTRAMUSCULAR | Status: AC
  Filled 2013-07-28: qty 1

## 2013-07-28 MED ORDER — SCOPOLAMINE 1 MG/3DAYS TD PT72
MEDICATED_PATCH | TRANSDERMAL | Status: AC
  Filled 2013-07-28: qty 1

## 2013-07-28 MED ORDER — SUCCINYLCHOLINE CHLORIDE 20 MG/ML IJ SOLN
INTRAMUSCULAR | Status: DC | PRN
  Administered 2013-07-28: 50 mg via INTRAVENOUS

## 2013-07-28 MED ORDER — SODIUM CHLORIDE 0.9 % IR SOLN
Status: DC | PRN
  Administered 2013-07-28: 08:00:00

## 2013-07-28 MED ORDER — LACTATED RINGERS IV SOLN
INTRAVENOUS | Status: DC
  Administered 2013-07-28 (×2): via INTRAVENOUS

## 2013-07-28 MED ORDER — OXYCODONE HCL 5 MG PO TABS
5.0000 mg | ORAL_TABLET | Freq: Once | ORAL | Status: AC | PRN
  Administered 2013-07-28: 5 mg via ORAL

## 2013-07-28 MED ORDER — ONDANSETRON HCL 4 MG/2ML IJ SOLN
INTRAMUSCULAR | Status: DC | PRN
  Administered 2013-07-28: 4 mg via INTRAVENOUS

## 2013-07-28 MED ORDER — PROPOFOL 10 MG/ML IV BOLUS
INTRAVENOUS | Status: DC | PRN
  Administered 2013-07-28: 200 mg via INTRAVENOUS

## 2013-07-28 MED ORDER — MIDAZOLAM HCL 2 MG/2ML IJ SOLN
INTRAMUSCULAR | Status: AC
  Filled 2013-07-28: qty 2

## 2013-07-28 SURGICAL SUPPLY — 81 items
ADH SKN CLS APL DERMABOND .7 (GAUZE/BANDAGES/DRESSINGS) ×2
BAG DECANTER FOR FLEXI CONT (MISCELLANEOUS) ×4 IMPLANT
BINDER BREAST LRG (GAUZE/BANDAGES/DRESSINGS) IMPLANT
BINDER BREAST MEDIUM (GAUZE/BANDAGES/DRESSINGS) IMPLANT
BINDER BREAST XLRG (GAUZE/BANDAGES/DRESSINGS) IMPLANT
BINDER BREAST XXLRG (GAUZE/BANDAGES/DRESSINGS) IMPLANT
BIOPATCH RED 1 DISK 7.0 (GAUZE/BANDAGES/DRESSINGS) ×3 IMPLANT
BIOPATCH RED 1IN DISK 7.0MM (GAUZE/BANDAGES/DRESSINGS) ×1
BLADE HEX COATED 2.75 (ELECTRODE) ×4 IMPLANT
BLADE SURG 10 STRL SS (BLADE) ×4 IMPLANT
BLADE SURG 15 STRL LF DISP TIS (BLADE) ×2 IMPLANT
BLADE SURG 15 STRL SS (BLADE) ×8
BNDG GAUZE ELAST 4 BULKY (GAUZE/BANDAGES/DRESSINGS) ×8 IMPLANT
CANISTER SUCT 1200ML W/VALVE (MISCELLANEOUS) ×4 IMPLANT
CHLORAPREP W/TINT 26ML (MISCELLANEOUS) ×4 IMPLANT
CLOSURE WOUND 1/2 X4 (GAUZE/BANDAGES/DRESSINGS)
CORDS BIPOLAR (ELECTRODE) ×2 IMPLANT
COVER MAYO STAND STRL (DRAPES) ×4 IMPLANT
COVER TABLE BACK 60X90 (DRAPES) ×4 IMPLANT
DECANTER SPIKE VIAL GLASS SM (MISCELLANEOUS) IMPLANT
DERMABOND ADVANCED (GAUZE/BANDAGES/DRESSINGS) ×2
DERMABOND ADVANCED .7 DNX12 (GAUZE/BANDAGES/DRESSINGS) ×2 IMPLANT
DRAIN CHANNEL 19F RND (DRAIN) ×4 IMPLANT
DRAPE LAPAROSCOPIC ABDOMINAL (DRAPES) ×4 IMPLANT
DRSG PAD ABDOMINAL 8X10 ST (GAUZE/BANDAGES/DRESSINGS) ×8 IMPLANT
DRSG TEGADERM 2-3/8X2-3/4 SM (GAUZE/BANDAGES/DRESSINGS) ×2 IMPLANT
ELECT BLADE 4.0 EZ CLEAN MEGAD (MISCELLANEOUS) ×4
ELECT REM PT RETURN 9FT ADLT (ELECTROSURGICAL) ×4
ELECTRODE BLDE 4.0 EZ CLN MEGD (MISCELLANEOUS) ×2 IMPLANT
ELECTRODE REM PT RTRN 9FT ADLT (ELECTROSURGICAL) ×2 IMPLANT
EVACUATOR SILICONE 100CC (DRAIN) ×4 IMPLANT
GAUZE SPONGE 4X4 12PLY STRL (GAUZE/BANDAGES/DRESSINGS) IMPLANT
GLOVE BIO SURGEON STRL SZ 6.5 (GLOVE) ×8 IMPLANT
GLOVE BIO SURGEONS STRL SZ 6.5 (GLOVE) ×4
GLOVE BIOGEL PI IND STRL 7.0 (GLOVE) IMPLANT
GLOVE BIOGEL PI INDICATOR 7.0 (GLOVE) ×2
GLOVE ECLIPSE 7.0 STRL STRAW (GLOVE) ×2 IMPLANT
GOWN STRL REUS W/ TWL LRG LVL3 (GOWN DISPOSABLE) ×4 IMPLANT
GOWN STRL REUS W/TWL LRG LVL3 (GOWN DISPOSABLE) ×12
IMPL BREAST GEL 535CC (Breast) IMPLANT
IMPLANT BREAST GEL 535CC (Breast) ×4 IMPLANT
IV NS 1000ML (IV SOLUTION)
IV NS 1000ML BAXH (IV SOLUTION) IMPLANT
IV NS 500ML (IV SOLUTION)
IV NS 500ML BAXH (IV SOLUTION) ×2 IMPLANT
KIT FILL SYSTEM UNIVERSAL (SET/KITS/TRAYS/PACK) IMPLANT
NDL HYPO 25X1 1.5 SAFETY (NEEDLE) ×2 IMPLANT
NEEDLE HYPO 25X1 1.5 SAFETY (NEEDLE) ×4 IMPLANT
NS IRRIG 1000ML POUR BTL (IV SOLUTION) ×2 IMPLANT
PACK BASIN DAY SURGERY FS (CUSTOM PROCEDURE TRAY) ×4 IMPLANT
PENCIL BUTTON HOLSTER BLD 10FT (ELECTRODE) ×4 IMPLANT
PIN SAFETY STERILE (MISCELLANEOUS) ×4 IMPLANT
SIZER BREAST 450CC (SIZER) ×4
SIZER BREAST REUSE 535CC (SIZER) ×4
SIZER BRST P5.1XHI 450CC (SIZER) IMPLANT
SIZER BRST REUSE ULT HI 535CC (SIZER) IMPLANT
SIZER GENERIC MENTOR (SIZER) ×4 IMPLANT
SLEEVE SCD COMPRESS KNEE MED (MISCELLANEOUS) ×4 IMPLANT
SPONGE GAUZE 4X4 12PLY STER LF (GAUZE/BANDAGES/DRESSINGS) IMPLANT
SPONGE LAP 18X18 X RAY DECT (DISPOSABLE) ×8 IMPLANT
STRIP CLOSURE SKIN 1/2X4 (GAUZE/BANDAGES/DRESSINGS) ×2 IMPLANT
STRIP SUTURE WOUND CLOSURE 1/2 (SUTURE) ×2 IMPLANT
SUT MNCRL AB 4-0 PS2 18 (SUTURE) ×8 IMPLANT
SUT MON AB 3-0 SH 27 (SUTURE) ×8
SUT MON AB 3-0 SH27 (SUTURE) IMPLANT
SUT MON AB 5-0 PS2 18 (SUTURE) ×8 IMPLANT
SUT PDS 3-0 CT2 (SUTURE) ×4
SUT PDS AB 2-0 CT2 27 (SUTURE) IMPLANT
SUT PDS II 3-0 CT2 27 ABS (SUTURE) IMPLANT
SUT SILK 3 0 PS 1 (SUTURE) ×4 IMPLANT
SUT VIC AB 3-0 SH 27 (SUTURE) ×12
SUT VIC AB 3-0 SH 27X BRD (SUTURE) ×2 IMPLANT
SUT VICRYL 4-0 PS2 18IN ABS (SUTURE) ×2 IMPLANT
SYR 50ML LL SCALE MARK (SYRINGE) IMPLANT
SYR BULB IRRIGATION 50ML (SYRINGE) ×4 IMPLANT
SYR CONTROL 10ML LL (SYRINGE) ×4 IMPLANT
TOWEL OR 17X24 6PK STRL BLUE (TOWEL DISPOSABLE) ×8 IMPLANT
TUBE CONNECTING 20'X1/4 (TUBING) ×1
TUBE CONNECTING 20X1/4 (TUBING) ×3 IMPLANT
UNDERPAD 30X30 INCONTINENT (UNDERPADS AND DIAPERS) ×8 IMPLANT
YANKAUER SUCT BULB TIP NO VENT (SUCTIONS) ×4 IMPLANT

## 2013-07-28 NOTE — H&P (Signed)
Emily Holder is an 58 y.o. female.   Chief Complaint: left breast implant rupture HPI: The patient is a 58 yrs old wf here for a history and physical for left breast implant removal and replacement and right breast mastopexy. She underwent left mastectomy with reconstruction using a Becker implant in 1988 in [place] (600cc with 400 cc fill). She has noticed a decrease in volume over the past year with more rippling and changing of the shape. She gets regular mammagrams and this year's mammogram was negative for cancer signs. She is 5 feet 4 inches tall, weighs 162 pounds and went from a 34 C to a 36 D after the original reconstruction. The left side is smaller now. No masses or nodules were palpated.  Past Medical History  Diagnosis Date  . Cancer     hx lt breast ca  . RJJOACZY(606.3)     Past Surgical History  Procedure Laterality Date  . Colonoscopy    . Mastectomy, modified radical w/reconstruction  1988    left  . Areola/nipple reconstruction with graft  1999    left    History reviewed. No pertinent family history. Social History:  reports that she has never smoked. She does not have any smokeless tobacco history on file. She reports that she drinks alcohol. She reports that she does not use illicit drugs.  Allergies: No Known Allergies  Medications Prior to Admission  Medication Sig Dispense Refill  . almotriptan (AXERT) 12.5 MG tablet Take 12.5 mg by mouth as needed for migraine. may repeat in 2 hours if needed      . Omega-3 Fatty Acids (FISH OIL) 1000 MG CAPS Take by mouth.      . vitamin C (ASCORBIC ACID) 500 MG tablet Take 500 mg by mouth daily.        Results for orders placed during the hospital encounter of 07/28/13 (from the past 48 hour(s))  POCT HEMOGLOBIN-HEMACUE     Status: None   Collection Time    07/28/13  7:11 AM      Result Value Ref Range   Hemoglobin 14.1  12.0 - 15.0 g/dL   No results found.  Review of Systems  Constitutional: Negative.    HENT: Negative.   Eyes: Negative.   Respiratory: Negative.   Cardiovascular: Negative.   Gastrointestinal: Negative.   Genitourinary: Negative.   Musculoskeletal: Negative.   Skin: Negative.   Neurological: Negative.   Psychiatric/Behavioral: Negative.     Blood pressure 123/84, pulse 79, temperature 98.3 F (36.8 C), temperature source Oral, resp. rate 18, height 5\' 4"  (1.626 m), weight 73.256 kg (161 lb 8 oz), SpO2 97.00%. Physical Exam  Constitutional: She is oriented to person, place, and time. She appears well-developed and well-nourished.  HENT:  Head: Normocephalic and atraumatic.  Eyes: Conjunctivae and EOM are normal. Pupils are equal, round, and reactive to light.  Cardiovascular: Normal rate.   Respiratory: Effort normal.  GI: Soft.  Musculoskeletal: Normal range of motion.  Neurological: She is alert and oriented to person, place, and time.  Skin: Skin is warm.  Psychiatric: She has a normal mood and affect. Her behavior is normal. Judgment and thought content normal.     Assessment/Plan Removal of left breast implant with replacement and right breast mastopexy.  [place] 07/28/2013, 7:20 AM

## 2013-07-28 NOTE — Anesthesia Preprocedure Evaluation (Signed)
Anesthesia Evaluation  Patient identified by MRN, date of birth, ID band Patient awake    Reviewed: Allergy & Precautions, H&P , NPO status , Patient's Chart, lab work & pertinent test results  Airway Mallampati: I TM Distance: >3 FB Neck ROM: Full    Dental  (+) Teeth Intact, Dental Advisory Given   Pulmonary  breath sounds clear to auscultation        Cardiovascular Rhythm:Regular Rate:Normal     Neuro/Psych    GI/Hepatic   Endo/Other    Renal/GU      Musculoskeletal   Abdominal   Peds  Hematology   Anesthesia Other Findings   Reproductive/Obstetrics                           Anesthesia Physical Anesthesia Plan  ASA: II  Anesthesia Plan: General   Post-op Pain Management:    Induction: Intravenous  Airway Management Planned: Oral ETT  Additional Equipment:   Intra-op Plan:   Post-operative Plan: Extubation in OR  Informed Consent: I have reviewed the patients History and Physical, chart, labs and discussed the procedure including the risks, benefits and alternatives for the proposed anesthesia with the patient or authorized representative who has indicated his/her understanding and acceptance.   Dental advisory given  Plan Discussed with: CRNA, Anesthesiologist and Surgeon  Anesthesia Plan Comments:        Anesthesia Quick Evaluation  

## 2013-07-28 NOTE — Anesthesia Procedure Notes (Addendum)
Performed by: Melynda Ripple D   Procedure Name: Intubation Date/Time: 07/28/2013 7:42 AM Performed by: Melynda Ripple D Pre-anesthesia Checklist: Patient identified, Emergency Drugs available, Suction available and Patient being monitored Patient Re-evaluated:Patient Re-evaluated prior to inductionOxygen Delivery Method: Circle System Utilized Preoxygenation: Pre-oxygenation with 100% oxygen Intubation Type: IV induction Ventilation: Mask ventilation without difficulty Laryngoscope Size: Mac and 3 Grade View: Grade I Tube type: Oral Number of attempts: 1 Airway Equipment and Method: stylet and oral airway Placement Confirmation: ETT inserted through vocal cords under direct vision,  positive ETCO2 and breath sounds checked- equal and bilateral Secured at: 22 cm Tube secured with: Tape Dental Injury: Teeth and Oropharynx as per pre-operative assessment

## 2013-07-28 NOTE — Op Note (Signed)
Op report Unilateral Breast Exchange   DATE OF OPERATION:  07/28/2013  LOCATION: [facility]  SURGICAL DIVISION: Plastic Surgery  PREOPERATIVE DIAGNOSES:  1. History of left breast cancer.  2. 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   POSTOPERATIVE DIAGNOSES: same  PROCEDURE:  1. Left breast exchange of ruptured implant with placement of a new implant.  2. Left breast capsulotomies. 3. Right breast mastopexy.  SURGEON: Theodoro Kos, DO  ASSISTANT: Shawn Rayburn, PA  ANESTHESIA:  General.   COMPLICATIONS: None.   IMPLANTS: Mentor Smooth Round Ultra High Profile Gel 530cc. Ref #[id number].  Serial Number [id number]  TISSUE: 1. Rightl breast mastopexy.  Right reduction 22g.  INDICATIONS FOR PROCEDURE:  The patient, Emily Holder, is a 58 y.o. female born on 09/23/1955, is here for treatment of a ruptured left breast implant and symmetry surgery with a right breast mastopexy.  MRN: [id number]  CONSENT:  Informed consent was obtained directly from the patient. Risks, benefits and alternatives were fully discussed. Specific risks including but not limited to bleeding, infection, hematoma, seroma, scarring, pain, implant infection, implant extrusion, capsular contracture, asymmetry, wound healing problems, and need for further surgery were all discussed. The patient did have an ample opportunity to have her questions answered to her satisfaction.   DESCRIPTION OF PROCEDURE:  The patient was taken to the operating room. SCDs were placed and IV antibiotics were given. The patient's chest was prepped and draped in a sterile fashion. A time out was performed and the implants to be used were identified.  One percent Xylocaine with epinephrine was used to infiltrate the area.   The left lateral old mastectomy scar was opened and superior mastectomy and inferior mastectomy flaps were re-raised over the pectoralis major muscle. The pectoralis was  split to expose the implant which was removed with the entire capsule. Inspection of the pocket showed a ruptured silicone/saline implant. Complete capsulotomies were performed to allow for breast pocket expansion.  Measurements were made to confirm adequate pocket size for the implant dimensions.  Hemostasis was ensured.  Gloves were changed. The implant was placed in the pocket and oriented appropriately. A drain was placed and secured with a 3-0 silk. The pectoralis major muscle was closed with a 3-0 running Vicryl suture. The remaining skin was closed with 4-0 Monocryl deep dermal and 5-0 Monocryl subcuticular stitches.   For the right breast, preoperative markings were confirmed.  Incision lines were injected with 1% Xylocaine with epinephrine.  After waiting for vasoconstriction, the marked lines were incised.  A Wise-pattern superomedial breast reduction was performed by de-epithelializing the pedicle, using bovie to create the superomedial pedicle, and removing breast tissue from the inferior portion of the breast.  Care was taken to not undermine the breast pedicle. Hemostasis was achieved.  The nipple was gently rotated into position and the skin was temporarily closed.  The patient was sat upright and size and shape symmetry was confirmed.  The pocket irrigated and hemostasis confirmed.  The deep tissues were approximated with 3-0 monocryl sutures and the skin was closed with deep dermal and subcuticular 4-0 Monocryl sutures.  The nipple and skin flaps had good capillary refill at the end of the procedure.  Dermabond was applied.  A breast binder and ABD was applied.   The patient tolerated the procedure well.The patient was allowed to wake from anesthesia and taken to the recovery room in satisfactory condition.

## 2013-07-28 NOTE — Discharge Instructions (Addendum)
Continue binder and drain care.  About my Jackson-Pratt Bulb Drain  What is a Jackson-Pratt bulb? A Jackson-Pratt is a soft, round device used to collect drainage. It is connected to a long, thin drainage catheter, which is held in place by one or two small stiches near your surgical incision site. When the bulb is squeezed, it forms a vacuum, forcing the drainage to empty into the bulb.  Emptying the Jackson-Pratt bulb- To empty the bulb: 1. Release the plug on the top of the bulb. 2. Pour the bulb's contents into a measuring container which your nurse will provide. 3. Record the time emptied and amount of drainage. Empty the drain(s) as often as your     doctor or nurse recommends.  Date                  Time                    Amount (Drain 1)                 Amount (Drain 2)  _____________________________________________________________________  _____________________________________________________________________  _____________________________________________________________________  _____________________________________________________________________  _____________________________________________________________________  _____________________________________________________________________  _____________________________________________________________________  _____________________________________________________________________  Squeezing the Jackson-Pratt Bulb- To squeeze the bulb: 1. Make sure the plug at the top of the bulb is open. 2. Squeeze the bulb tightly in your fist. You will hear air squeezing from the bulb. 3. Replace the plug while the bulb is squeezed. 4. Use a safety pin to attach the bulb to your clothing. This will keep the catheter from     pulling at the bulb insertion site.  When to call your doctor- Call your doctor if:  Drain site becomes red, swollen or hot.  You have a fever greater than 101 degrees F.  There is oozing at the drain  site.  Drain falls out (apply a guaze bandage over the drain hole and secure it with tape).  Drainage increases daily not related to activity patterns. (You will usually have more drainage when you are active than when you are resting.)  Drainage has a bad odor.   Post Anesthesia Home Care Instructions  Activity: Get plenty of rest for the remainder of the day. A responsible adult should stay with you for 24 hours following the procedure.  For the next 24 hours, DO NOT: -Drive a car -Paediatric nurse -Drink alcoholic beverages -Take any medication unless instructed by your physician -Make any legal decisions or sign important papers.  Meals: Start with liquid foods such as gelatin or soup. Progress to regular foods as tolerated. Avoid greasy, spicy, heavy foods. If nausea and/or vomiting occur, drink only clear liquids until the nausea and/or vomiting subsides. Call your physician if vomiting continues.  Special Instructions/Symptoms: Your throat may feel dry or sore from the anesthesia or the breathing tube placed in your throat during surgery. If this causes discomfort, gargle with warm salt water. The discomfort should disappear within 24 hours.  Post Anesthesia Home Care Instructions  Activity: Get plenty of rest for the remainder of the day. A responsible adult should stay with you for 24 hours following the procedure.  For the next 24 hours, DO NOT: -Drive a car -Paediatric nurse -Drink alcoholic beverages -Take any medication unless instructed by your physician -Make any legal decisions or sign important papers.  Meals: Start with liquid foods such as gelatin or soup. Progress to regular foods as tolerated. Avoid greasy, spicy, heavy foods. If nausea and/or vomiting occur, drink  only clear liquids until the nausea and/or vomiting subsides. Call your physician if vomiting continues.  Special Instructions/Symptoms: Your throat may feel dry or sore from the anesthesia  or the breathing tube placed in your throat during surgery. If this causes discomfort, gargle with warm salt water. The discomfort should disappear within 24 hours.

## 2013-07-28 NOTE — Anesthesia Postprocedure Evaluation (Signed)
  Anesthesia Post-op Note  Patient: Emily Holder  Procedure(s) Performed: Procedure(s): BREAST RECONSTRUCTION WITH REMOVAL OF IMPLANT AND REPLACEMENT OF IMPLANT ON THE LEFT (Left) MASTOPEXY ON THE RIGHT (Right)  Patient Location: PACU  Anesthesia Type:General  Level of Consciousness: awake, alert  and oriented  Airway and Oxygen Therapy: Patient Spontanous Breathing and Patient connected to face mask oxygen  Post-op Pain: mild  Post-op Assessment: Post-op Vital signs reviewed  Post-op Vital Signs: Reviewed  Last Vitals:  Filed Vitals:   07/28/13 1115  BP: 138/81  Pulse: 99  Temp:   Resp: 11    Complications: No apparent anesthesia complications

## 2013-07-28 NOTE — Transfer of Care (Signed)
Immediate Anesthesia Transfer of Care Note  Patient: Emily Holder  Procedure(s) Performed: Procedure(s): BREAST RECONSTRUCTION WITH REMOVAL OF IMPLANT AND REPLACEMENT OF IMPLANT ON THE LEFT (Left) MASTOPEXY ON THE RIGHT (Right)  Patient Location: PACU  Anesthesia Type:General  Level of Consciousness: awake, alert  and oriented  Airway & Oxygen Therapy: Patient Spontanous Breathing and Patient connected to face mask oxygen  Post-op Assessment: Report given to PACU RN and Post -op Vital signs reviewed and stable  Post vital signs: Reviewed and stable  Complications: No apparent anesthesia complications

## 2013-07-28 NOTE — Brief Op Note (Signed)
07/28/2013  10:14 AM  PATIENT:  Raymond Gurney  58 y.o. female  PRE-OPERATIVE DIAGNOSIS:  history of breast cancer and ruptured implant  POST-OPERATIVE DIAGNOSIS:  history of breast cancer and ruptured implant  PROCEDURE:  Procedure(s): BREAST RECONSTRUCTION WITH REMOVAL OF IMPLANT AND REPLACEMENT OF IMPLANT ON THE LEFT (Left) MASTOPEXY ON THE RIGHT (Right)  SURGEON:  Surgeon(s) and Role:    * Claire Sanger, DO - Primary  PHYSICIAN ASSISTANT: Shawn Rayburn, PA  ASSISTANTS: none   ANESTHESIA:   general  EBL:  Total I/O In: 1500 [I.V.:1500] Out: -   BLOOD ADMINISTERED:none  DRAINS: (1) Jackson-Pratt drain(s) with closed bulb suction in the left breast pocket.   LOCAL MEDICATIONS USED:  MARCAINE     SPECIMEN:  Source of Specimen:  right breast tissue and left breast capsule.  DISPOSITION OF SPECIMEN:  PATHOLOGY  COUNTS:  YES  TOURNIQUET:  * No tourniquets in log *  DICTATION: .Dragon Dictation  PLAN OF CARE: Discharge to home after PACU  PATIENT DISPOSITION:  PACU - hemodynamically stable.   Delay start of Pharmacological VTE agent (>24hrs) due to surgical blood loss or risk of bleeding: no

## 2013-08-01 ENCOUNTER — Encounter (HOSPITAL_BASED_OUTPATIENT_CLINIC_OR_DEPARTMENT_OTHER): Payer: Self-pay | Admitting: Plastic Surgery

## 2013-10-19 ENCOUNTER — Emergency Department (HOSPITAL_COMMUNITY)
Admission: EM | Admit: 2013-10-19 | Discharge: 2013-10-19 | Disposition: A | Discharge status: Home / Self Care | Payer: Managed Care, Other (non HMO) | Attending: Emergency Medicine | Admitting: Emergency Medicine

## 2013-10-19 ENCOUNTER — Encounter (HOSPITAL_COMMUNITY): Payer: Self-pay | Admitting: Emergency Medicine

## 2013-10-19 DIAGNOSIS — Z79899 Other long term (current) drug therapy: Secondary | ICD-10-CM | POA: Insufficient documentation

## 2013-10-19 DIAGNOSIS — Y9229 Other specified public building as the place of occurrence of the external cause: Secondary | ICD-10-CM | POA: Insufficient documentation

## 2013-10-19 DIAGNOSIS — R229 Localized swelling, mass and lump, unspecified: Secondary | ICD-10-CM | POA: Insufficient documentation

## 2013-10-19 DIAGNOSIS — Y9389 Activity, other specified: Secondary | ICD-10-CM | POA: Diagnosis not present

## 2013-10-19 DIAGNOSIS — Z853 Personal history of malignant neoplasm of breast: Secondary | ICD-10-CM | POA: Diagnosis not present

## 2013-10-19 DIAGNOSIS — T628X1A Toxic effect of other specified noxious substances eaten as food, accidental (unintentional), initial encounter: Secondary | ICD-10-CM | POA: Insufficient documentation

## 2013-10-19 DIAGNOSIS — T7840XA Allergy, unspecified, initial encounter: Secondary | ICD-10-CM

## 2013-10-19 MED ORDER — EPINEPHRINE 0.3 MG/0.3ML IJ SOAJ: 0.3000 mg | Freq: Once | INTRAMUSCULAR | Status: DC | PRN

## 2013-10-19 MED ORDER — DIPHENHYDRAMINE HCL 50 MG/ML IJ SOLN
25.0000 mg | Freq: Once | INTRAMUSCULAR | Status: AC
  Administered 2013-10-19: 25 mg via INTRAVENOUS
  Filled 2013-10-19: qty 1

## 2013-10-19 MED ORDER — PREDNISONE 20 MG PO TABS: 40.0000 mg | ORAL_TABLET | Freq: Every day | ORAL | Status: DC

## 2013-10-19 MED ORDER — FAMOTIDINE IN NACL 20-0.9 MG/50ML-% IV SOLN
20.0000 mg | Freq: Once | INTRAVENOUS | Status: AC
  Administered 2013-10-19: 20 mg via INTRAVENOUS
  Filled 2013-10-19: qty 50

## 2013-10-19 MED ORDER — METHYLPREDNISOLONE SODIUM SUCC 125 MG IJ SOLR
125.0000 mg | Freq: Once | INTRAMUSCULAR | Status: AC
  Administered 2013-10-19: 125 mg via INTRAVENOUS
  Filled 2013-10-19: qty 2

## 2013-10-19 MED ORDER — SODIUM CHLORIDE 0.9 % IV BOLUS (SEPSIS)
1000.0000 mL | Freq: Once | INTRAVENOUS | Status: AC
  Administered 2013-10-19: 1000 mL via INTRAVENOUS

## 2013-10-19 NOTE — ED Provider Notes (Signed)
Medical screening examination/treatment/procedure(s) were performed by non-physician practitioner and as supervising physician I was immediately available for consultation/collaboration.   EKG Interpretation None        Hoy Morn, MD 10/19/13 1625

## 2013-10-19 NOTE — ED Provider Notes (Signed)
CSN: [id number]     Arrival date & time 10/19/13  9379 History   First MD Initiated Contact with Patient 10/19/13 [phone]     Chief Complaint  Patient presents with  . Allergic Reaction     (Consider location/radiation/quality/duration/timing/severity/associated sxs/prior Treatment) The history is provided by the patient and medical records.   This is a 58 y.o. F with remote hx of breast cancer in 1988, headaches, presenting to the ED for allergic reaction.  Patient states yesterday she ate [employer] for lunch yesterday and approx 2 hours later developed a flushed sensation along her left cheek.  Patient states developed some swelling of her left cheek extending under left eye without lip involvement.  Called PCP and told to take zantac, allegra, benadryl around 1700 yesterday.  States swelling has improved but did develop more pressure in left sinus area.  Denies shortness of breath, difficulty swallowing, trouble speaking.  No recent changes in medications, patient not currently on any ACEI.  No new cosmetics, soaps, lotions, detergents, toothpaste, mouthwash etc.  No dental pain, abscesses, fever, chills.  States similar rxn 2 weeks ago after eating a mango.  Patient did have allergy testing several years ago, negative at that time.   Past Medical History  Diagnosis Date  . Cancer     hx lt breast ca  . [id number](242.6)    Past Surgical History  Procedure Laterality Date  . Colonoscopy    . Mastectomy, modified radical w/reconstruction  1988    left  . Areola/nipple reconstruction with graft  1999    left  . Breast reconstruction with placement of tissue expander and flex hd (acellular hydrated dermis) Left 07/28/2013    Procedure: BREAST RECONSTRUCTION WITH REMOVAL OF IMPLANT AND REPLACEMENT OF IMPLANT ON THE LEFT;  Surgeon: Theodoro Kos, DO;  Location: [place];  Service: Plastics;  Laterality: Left;  Marland Kitchen Mastopexy Right 07/28/2013    Procedure: MASTOPEXY ON THE RIGHT;   Surgeon: Theodoro Kos, DO;  Location: [place];  Service: Plastics;  Laterality: Right;   No family history on file. History  Substance Use Topics  . Smoking status: Never Smoker   . Smokeless tobacco: Not on file  . Alcohol Use: Yes     Comment: daily wine   OB History   Grav Para Term Preterm Abortions TAB SAB Ect Mult Living                 Review of Systems  HENT: Positive for facial swelling.   All other systems reviewed and are negative.     Allergies  Review of patient's allergies indicates no known allergies.  Home Medications   Prior to Admission medications   Medication Sig Start Date End Date Taking? Authorizing Provider  almotriptan (AXERT) 12.5 MG tablet Take 12.5 mg by mouth as needed for migraine. may repeat in 2 hours if needed    Historical Provider, MD  Omega-3 Fatty Acids (FISH OIL) 1000 MG CAPS Take by mouth.    Historical Provider, MD  vitamin C (ASCORBIC ACID) 500 MG tablet Take 500 mg by mouth daily.    Historical Provider, MD   BP 133/112  Pulse 93  Temp(Src) 98.1 F (36.7 C) (Oral)  Resp 20  SpO2 98%  Physical Exam  Nursing note and vitals reviewed. Constitutional: She is oriented to person, place, and time. She appears well-developed and well-nourished.  HENT:  Head: Normocephalic and atraumatic.  Mouth/Throat: Uvula is midline, oropharynx is clear and  moist and mucous membranes are normal. No oral lesions. No trismus in the jaw. No oropharyngeal exudate, posterior oropharyngeal edema, posterior oropharyngeal erythema or tonsillar abscesses.  Some flushing of left cheek compared to right; mild swelling extending from under left eye to chin without lip or neck involvement; airway patent; handling secretions appropriately; no difficulty swallowing or speaking; good dentition without pain or signs of dental abscess  Eyes: Conjunctivae and EOM are normal. Pupils are equal, round, and reactive to light.  Neck: Normal range of  motion.  Cardiovascular: Normal rate, regular rhythm and normal heart sounds.   Pulmonary/Chest: Effort normal and breath sounds normal. No respiratory distress. She has no wheezes.  Abdominal: Soft. Bowel sounds are normal. There is no tenderness. There is no guarding.  Musculoskeletal: Normal range of motion.  Neurological: She is alert and oriented to person, place, and time.  Skin: Skin is warm and dry.  Psychiatric: She has a normal mood and affect.    ED Course  Procedures (including critical care time) Labs Review Labs Reviewed - No data to display  Imaging Review No results found.   EKG Interpretation None      MDM   Final diagnoses:  Allergic reaction, initial encounter   58 year old female with allergic reaction beginning yesterday after eating [employer]. She has been having issues with allergic reactions over the past 2 weeks despite negative allergy testing several years ago. On exam she does have some flushing and swelling of her left cheek without tongue, lip, or neck involvement. She has no difficulty swallowing or speaking and is handling her secretions well. She is not on any ACE inhibitors and this does not appear to be angioedema.  She was given benadryl, solu-medrol, and pepcid in the ED with improvement of her symptoms. Her airway remains patent, drinking water without difficulty. She has a followup appointment with her primary care physician this afternoon. She is discharged home with a prednisone taper and an EpiPen. She was instructed on use of EpiPen and need for emergent evaluation after use.  Discussed plan with patient, he/she acknowledged understanding and agreed with plan of care.  Return precautions given for new or worsening symptoms.  Larene Pickett, PA-C 10/19/13 [phone]

## 2013-10-19 NOTE — Discharge Instructions (Signed)
Take the prescribed medication as directed starting tomorrow, you have already had today's dose. Use epi-pen as we discussed for life threatening reaction-- tongue swelling, trouble swallowing/breathing, etc.  If you use epi-pen you must come to the ER for evaluation afterwards. Follow-up with your primary care physician Return to the ED for new or worsening symptoms.

## 2013-10-19 NOTE — ED Notes (Signed)
Pt states she ate at [employer] yesterday for lunch, about 2 hours after started having L sided facial swelling especially under L eye, states saw doctor and was told to take zantac, allerga and benadryl around 5 pm, then before she went to bed took another 25mg  of benadryl, has not taken any medications since, states when she woke up she felt more pressure in her sinus area, denies shortness of breath or difficulty breathing. Obvious swelling noted to L side of face.

## 2013-10-19 NOTE — ED Notes (Signed)
Swelling noticeably reduced at left side of face.  She states she feels "much better" and remains in no distress.

## 2013-11-07 ENCOUNTER — Emergency Department (HOSPITAL_COMMUNITY)
Admission: EM | Admit: 2013-11-07 | Discharge: 2013-11-07 | Disposition: A | Discharge status: Home / Self Care | Payer: Managed Care, Other (non HMO) | Attending: Emergency Medicine | Admitting: Emergency Medicine

## 2013-11-07 ENCOUNTER — Encounter (HOSPITAL_COMMUNITY): Payer: Self-pay | Admitting: Emergency Medicine

## 2013-11-07 DIAGNOSIS — R000 Tachycardia, unspecified: Secondary | ICD-10-CM | POA: Insufficient documentation

## 2013-11-07 DIAGNOSIS — Z853 Personal history of malignant neoplasm of breast: Secondary | ICD-10-CM | POA: Insufficient documentation

## 2013-11-07 DIAGNOSIS — R061 Stridor: Secondary | ICD-10-CM | POA: Insufficient documentation

## 2013-11-07 DIAGNOSIS — Y9289 Other specified places as the place of occurrence of the external cause: Secondary | ICD-10-CM | POA: Insufficient documentation

## 2013-11-07 DIAGNOSIS — T628X1A Toxic effect of other specified noxious substances eaten as food, accidental (unintentional), initial encounter: Secondary | ICD-10-CM | POA: Insufficient documentation

## 2013-11-07 DIAGNOSIS — R220 Localized swelling, mass and lump, head: Secondary | ICD-10-CM | POA: Insufficient documentation

## 2013-11-07 DIAGNOSIS — R259 Unspecified abnormal involuntary movements: Secondary | ICD-10-CM | POA: Insufficient documentation

## 2013-11-07 DIAGNOSIS — R221 Localized swelling, mass and lump, neck: Principal | ICD-10-CM

## 2013-11-07 DIAGNOSIS — Y9389 Activity, other specified: Secondary | ICD-10-CM | POA: Diagnosis not present

## 2013-11-07 DIAGNOSIS — T7840XA Allergy, unspecified, initial encounter: Secondary | ICD-10-CM

## 2013-11-07 MED ORDER — EPINEPHRINE 0.3 MG/0.3ML IJ SOAJ: 0.3000 mg | Freq: Once | INTRAMUSCULAR | Status: DC

## 2013-11-07 MED ORDER — FAMOTIDINE IN NACL 20-0.9 MG/50ML-% IV SOLN
20.0000 mg | Freq: Once | INTRAVENOUS | Status: AC
  Administered 2013-11-07: 20 mg via INTRAVENOUS
  Filled 2013-11-07: qty 50

## 2013-11-07 MED ORDER — DIPHENHYDRAMINE HCL 50 MG/ML IJ SOLN
25.0000 mg | Freq: Once | INTRAMUSCULAR | Status: AC
  Administered 2013-11-07: 25 mg via INTRAVENOUS
  Filled 2013-11-07: qty 1

## 2013-11-07 MED ORDER — METHYLPREDNISOLONE SODIUM SUCC 125 MG IJ SOLR
125.0000 mg | Freq: Once | INTRAMUSCULAR | Status: AC
  Administered 2013-11-07: 125 mg via INTRAVENOUS
  Filled 2013-11-07: qty 2

## 2013-11-07 NOTE — Discharge Instructions (Signed)
Food Allergy  Avoid all nuts. If swelling of your throat or change in your voice or difficulty breathing recurs, use the EpiPen immediately and return immediately. Please proceed with your allergy testing as planned A food allergy occurs from eating something you are sensitive to. Food allergies occur in all age groups. It may be passed to you from your parents (heredity).  CAUSES  Some common causes are cow's milk, seafood, eggs, nuts (including peanut butter), wheat, and soybeans. SYMPTOMS  Common problems are:   Swelling around the mouth.  An itchy, red rash.  Hives.  Vomiting.  Diarrhea. Severe allergic reactions are life-threatening. This reaction is called anaphylaxis. It can cause the mouth and throat to swell. This makes it hard to breathe and swallow. In severe reactions, only a small amount of food may be fatal within seconds. HOME CARE INSTRUCTIONS   If you are unsure what caused the reaction, keep a diary of foods eaten and symptoms that followed. Avoid foods that cause reactions.  If hives or rash are present:  Take medicines as directed.  Use an over-the-counter antihistamine (diphenhydramine) to treat hives and itching as needed.  Apply cold compresses to the skin or take baths in cool water. Avoid hot baths or showers. These will increase the redness and itching.  If you are severely allergic:  Hospitalization is often required following a severe reaction.  Wear a medical alert bracelet or necklace that describes the allergy.  Carry your anaphylaxis kit or epinephrine injection with you at all times. Both you and your family members should know how to use this. This can be lifesaving if you have a severe reaction. If epinephrine is used, it is important for you to seek immediate medical care or call your local emergency services (911 in [place] When the epinephrine wears off, it can be followed by a delayed reaction, which can be fatal.  Replace your epinephrine  immediately after use in case of another reaction.  Ask your caregiver for instructions if you have not been taught how to use an epinephrine injection.  Do not drive until medicines used to treat the reaction have worn off, unless approved by your caregiver. SEEK MEDICAL CARE IF:   You suspect a food allergy. Symptoms generally happen within 30 minutes of eating a food.  Your symptoms have not gone away within 2 days. See your caregiver sooner if symptoms are getting worse.  You develop new symptoms.  You want to retest yourself with a food or drink you think causes an allergic reaction. Never do this if an anaphylactic reaction to that food or drink has happened before.  There is a return of the symptoms which brought you to your caregiver. SEEK IMMEDIATE MEDICAL CARE IF:   You have trouble breathing, are wheezing, or you have a tight feeling in your chest or throat.  You have a swollen mouth, or you have hives, swelling, or itching all over your body. Use your epinephrine injection immediately. This is given into the outside of your thigh, deep into the muscle. Following use of the epinephrine injection, seek help right away. Seek immediate medical care or call your local emergency services (911 in [place] MAKE SURE YOU:   Understand these instructions.  Will watch your condition.  Will get help right away if you are not doing well or get worse. Document Released: 02/01/2000 Document Revised: 04/28/2011 Document Reviewed: 09/23/2007 [facility] Patient Information 2015 [place], [place]. This information is not intended to replace advice given to  you by your health care provider. Make sure you discuss any questions you have with your health care provider.

## 2013-11-07 NOTE — ED Provider Notes (Signed)
CSN: [id number]     Arrival date & time 11/07/13  1220 History   First MD Initiated Contact with Patient 11/07/13 1323     Chief Complaint  Patient presents with  . Allergic Reaction     (Consider location/radiation/quality/duration/timing/severity/associated sxs/prior Treatment) HPI Patient developed swelling of her throat and voice change 11:15 AM today after eating almonds which were cooked in in peanut oil. She treated herself with an EpiPen with marked improvement. Her voice is now normal. Throat feels much improved. She feels "shaky and jittery" since treatment with EpiPen, which is improving also. No other associated symptoms. Past Medical History  Diagnosis Date  . Cancer     hx lt breast ca  . [id number](893.8)    Past Surgical History  Procedure Laterality Date  . Colonoscopy    . Mastectomy, modified radical w/reconstruction  1988    left  . Areola/nipple reconstruction with graft  1999    left  . Breast reconstruction with placement of tissue expander and flex hd (acellular hydrated dermis) Left 07/28/2013    Procedure: BREAST RECONSTRUCTION WITH REMOVAL OF IMPLANT AND REPLACEMENT OF IMPLANT ON THE LEFT;  Surgeon: Theodoro Kos, DO;  Location: [place];  Service: Plastics;  Laterality: Left;  Marland Kitchen Mastopexy Right 07/28/2013    Procedure: MASTOPEXY ON THE RIGHT;  Surgeon: Theodoro Kos, DO;  Location: [place];  Service: Plastics;  Laterality: Right;   History reviewed. No pertinent family history. History  Substance Use Topics  . Smoking status: Never Smoker   . Smokeless tobacco: Not on file  . Alcohol Use: Yes     Comment: daily wine   OB History   Grav Para Term Preterm Abortions TAB SAB Ect Mult Living                 Review of Systems  Constitutional: Negative.   HENT: Positive for voice change.        Throat swelling  Respiratory: Negative.   Cardiovascular: Negative.   Gastrointestinal: Negative.   Musculoskeletal:  Negative.   Skin: Negative.   Neurological: Negative.   Psychiatric/Behavioral: Negative.   All other systems reviewed and are negative.     Allergies  Review of patient's allergies indicates no known allergies.  Home Medications   Prior to Admission medications   Medication Sig Start Date End Date Taking? Authorizing Provider  diphenhydrAMINE (BENADRYL) 25 MG tablet Take 25 mg by mouth every 6 (six) hours as needed for allergies (itching on lips).    Yes Historical Provider, MD  EPINEPHrine 0.3 mg/0.3 mL IJ SOAJ injection Inject 0.3 mg into the muscle once as needed (for severe allergic reaction & throat swelling). CAll 911 immediately if you have to use this medicine 10/19/13  Yes Larene Pickett, PA-C  fexofenadine (ALLEGRA) 180 MG tablet Take 180 mg by mouth daily as needed for allergies (itching on lips).    Yes Historical Provider, MD  ibuprofen (ADVIL,MOTRIN) 200 MG tablet Take 400 mg by mouth every 6 (six) hours as needed for headache or moderate pain (severe headache).    Yes Historical Provider, MD  ranitidine (ZANTAC) 150 MG tablet Take 150 mg by mouth daily as needed for heartburn (heartburn).    Yes Historical Provider, MD   BP 140/76  Pulse 125  Temp(Src) 98 F (36.7 C)  Resp 17  SpO2 97% Physical Exam  Nursing note and vitals reviewed. Constitutional: She appears well-developed and well-nourished. No distress.  HENT:  Head: Normocephalic and  atraumatic.  Mouth/Throat: Oropharynx is clear and moist.  Having secretions well no hoarseness.  Eyes: Conjunctivae are normal. Pupils are equal, round, and reactive to light.  Neck: Neck supple. No tracheal deviation present. No thyromegaly present.  Cardiovascular: Regular rhythm.   No murmur heard. Tachycardic  Pulmonary/Chest: Effort normal and breath sounds normal.  Abdominal: Soft. Bowel sounds are normal. She exhibits no distension. There is no tenderness.  Musculoskeletal: Normal range of motion. She exhibits no  edema and no tenderness.  Neurological: She is alert. Coordination normal.  Skin: Skin is warm and dry. No rash noted.  Psychiatric: She has a normal mood and affect.    ED Course  Procedures (including critical care time) Labs Review Labs Reviewed - No data to display  Imaging Review No results found.   EKG Interpretation None     3:35 PM patient asymptomatic . Looks well. No distress MDM  Plan prescription epipen . Pt advised to avoid nuts and to proceed with allergy testing Dx allergic reation Final diagnoses:  None        Orlie Dakin, MD 11/07/13 1538

## 2013-11-07 NOTE — ED Notes (Addendum)
Pt reports she had an allergic reaction 3 weeks ago, unsure of cause, was prescribed an epi pen and told she had to wait 6 weeks before being allergy tested. Today pt ate allmonds at 1130, pt reports 20-30 min later started having throat swelling and voice changes. Pt gave herself her epi pen. Reports now she feels jittery. Pt able to speak in full sentences. Denies nausea/vomiting. Denies pain.

## 2013-11-20 ENCOUNTER — Encounter (HOSPITAL_COMMUNITY): Payer: Self-pay | Admitting: Emergency Medicine

## 2013-11-20 ENCOUNTER — Emergency Department (HOSPITAL_COMMUNITY)
Admission: AD | Admit: 2013-11-20 | Discharge: 2013-11-20 | Disposition: A | Discharge status: Home / Self Care | Payer: Managed Care, Other (non HMO) | Attending: Emergency Medicine | Admitting: Emergency Medicine

## 2013-11-20 DIAGNOSIS — R1310 Dysphagia, unspecified: Secondary | ICD-10-CM | POA: Insufficient documentation

## 2013-11-20 DIAGNOSIS — R000 Tachycardia, unspecified: Secondary | ICD-10-CM | POA: Insufficient documentation

## 2013-11-20 DIAGNOSIS — Z79899 Other long term (current) drug therapy: Secondary | ICD-10-CM | POA: Insufficient documentation

## 2013-11-20 DIAGNOSIS — Z853 Personal history of malignant neoplasm of breast: Secondary | ICD-10-CM | POA: Diagnosis not present

## 2013-11-20 DIAGNOSIS — X58XXXA Exposure to other specified factors, initial encounter: Secondary | ICD-10-CM | POA: Insufficient documentation

## 2013-11-20 DIAGNOSIS — T7840XA Allergy, unspecified, initial encounter: Secondary | ICD-10-CM

## 2013-11-20 MED ORDER — EPINEPHRINE 0.3 MG/0.3ML IJ SOAJ
0.3000 mg | Freq: Once | INTRAMUSCULAR | Status: AC
  Administered 2013-11-20: 0.3 mg via INTRAMUSCULAR
  Filled 2013-11-20: qty 0.3

## 2013-11-20 NOTE — ED Notes (Signed)
Pt presents with c/o of throat swelling stating she could be having an allergic reaction, seen on the 21st September for the same, unable to use epi pen as she was driving. Denies shortness of breadth, chest pain concerned that throat swelling could get worse. Pt mildly anxious at this time.

## 2013-11-20 NOTE — ED Provider Notes (Signed)
CSN: [id number]     Arrival date & time 11/20/13  1615 History   First MD Initiated Contact with Patient 11/20/13 1623     Chief Complaint  Patient presents with  . Allergic Reaction     (Consider location/radiation/quality/duration/timing/severity/associated sxs/prior Treatment) HPI 58 year old female has an allergy appointment in just over a week and does not want steroids but had onset while driving today just prior to arrival developed recurrent throat tightness with difficulty swallowing just like prior allergic reactions but did not want to take her EpiPen while driving because it makes her feel jittery, she did take 50 mg Benadryl, she also just took 150 mg Zantac, she is no stridor or drooling no chest pain no shortness of breath no lightheadedness no rash no itching and no other concerns. She has had 2 recent allergic type similar episodes and is awaiting an allergy followup and therefore does not want any steroids. Past Medical History  Diagnosis Date  . Cancer     hx lt breast ca  . JMEQASTM(196.2)    Past Surgical History  Procedure Laterality Date  . Colonoscopy    . Mastectomy, modified radical w/reconstruction  1988    left  . Areola/nipple reconstruction with graft  1999    left  . Breast reconstruction with placement of tissue expander and flex hd (acellular hydrated dermis) Left 07/28/2013    Procedure: BREAST RECONSTRUCTION WITH REMOVAL OF IMPLANT AND REPLACEMENT OF IMPLANT ON THE LEFT;  Surgeon: Theodoro Kos, DO;  Location: [facility];  Service: Plastics;  Laterality: Left;  Marland Kitchen Mastopexy Right 07/28/2013    Procedure: MASTOPEXY ON THE RIGHT;  Surgeon: Theodoro Kos, DO;  Location: [facility];  Service: Plastics;  Laterality: Right;   History reviewed. No pertinent family history. History  Substance Use Topics  . Smoking status: Never Smoker   . Smokeless tobacco: Not on file  . Alcohol Use: Yes     Comment: daily wine   OB History    Grav Para Term Preterm Abortions TAB SAB Ect Mult Living                 Review of Systems  10 Systems reviewed and are negative for acute change except as noted in the HPI.  Allergies  Review of patient's allergies indicates no known allergies.  Home Medications   Prior to Admission medications   Medication Sig Start Date End Date Taking? Authorizing Provider  almotriptan (AXERT) 12.5 MG tablet Take 12.5 mg by mouth daily as needed for migraine. may repeat in 2 hours if needed   Yes Historical Provider, MD  cetirizine (ZYRTEC) 10 MG tablet Take 10 mg by mouth daily as needed (for allergic reactions).   Yes Historical Provider, MD  diphenhydrAMINE (BENADRYL) 25 MG tablet Take 50 mg by mouth every 6 (six) hours as needed for allergies (itching on lips).    Yes Historical Provider, MD  EPINEPHrine 0.3 mg/0.3 mL IJ SOAJ injection Inject 0.3 mg into the muscle once as needed (for severe allergic reaction & throat swelling). CAll 911 immediately if you have to use this medicine 10/19/13  Yes Larene Pickett, PA-C  ranitidine (ZANTAC) 150 MG tablet Take 150 mg by mouth daily as needed (for anaphylactic reactions).    Yes Historical Provider, MD   BP 138/81  Pulse 102  Temp(Src) 98.2 F (36.8 C) (Oral)  Resp 14  Ht 5\' 4"  (1.626 m)  Wt 160 lb (72.576 kg)  BMI 27.45 kg/m2  SpO2 99% Physical Exam  Nursing note and vitals reviewed. Constitutional:  Awake, alert, nontoxic appearance.  HENT:  Head: Atraumatic.  Eyes: Right eye exhibits no discharge. Left eye exhibits no discharge.  Neck: Neck supple.  Cardiovascular: Regular rhythm.   No murmur heard. Mildly tachycardic  Pulmonary/Chest: Effort normal and breath sounds normal. No stridor. No respiratory distress. She has no wheezes. She has no rales. She exhibits no tenderness.  Speech clear no stridor no drooling no dysphonia pulse oximetry normal room air 98%  Abdominal: Soft. She exhibits no distension. There is no tenderness. There  is no rebound and no guarding.  Musculoskeletal: She exhibits no tenderness.  Baseline ROM, no obvious new focal weakness.  Neurological: She is alert.  Mental status and motor strength appears baseline for patient and situation.  Skin: No rash noted.  Psychiatric: She has a normal mood and affect.    ED Course  Procedures (including critical care time) Asymptomatic. [place] - No data to display  Imaging Review No results found.   EKG Interpretation None      MDM   Final diagnoses:  Allergic reaction, initial encounter    Patient informed of clinical course, understand medical decision-making process, and agree with plan. I doubt any other EMC precluding discharge at this time including, but not necessarily limited to the following:anaphylaxis.    Babette Relic, MD 11/21/13 [phone]

## 2013-11-20 NOTE — Discharge Instructions (Signed)
Use your Benadryl and Zantac as previously directed. Use your EpiPen as previously directed.  SEEK MEDICAL ATTENTION IF: You still have considerable itching after taking the medication (prescribed or purchased over the counter) for 24 hours.  A temperature above 100.4 develops.  You have any pain or swelling in your joints. You develop new and unexplained symptoms (problems). SEEK IMMEDIATE MEDICAL ATTENTION IF: You have swollen lips or tongue; difficulty breathing; dizziness; unable to swallow; new rash; or other concerns.

## 2014-08-22 ENCOUNTER — Encounter: Payer: Self-pay | Admitting: *Deleted

## 2014-09-20 ENCOUNTER — Encounter: Payer: Self-pay | Admitting: Internal Medicine

## 2015-10-08 ENCOUNTER — Encounter: Payer: Self-pay | Admitting: Internal Medicine

## 2015-10-09 ENCOUNTER — Other Ambulatory Visit: Payer: Self-pay | Admitting: Internal Medicine

## 2015-10-09 DIAGNOSIS — R1310 Dysphagia, unspecified: Secondary | ICD-10-CM

## 2015-10-15 ENCOUNTER — Telehealth: Payer: Self-pay

## 2015-10-15 NOTE — Telephone Encounter (Signed)
  Dr Carlean Purl reviewed Lauralynn's records from [facility] and advised the following:  Cancel DG esophagus and do a direct EGD with possible dilation.  Seini would like to do this so a pre-visit and EGD appointment set up.  I canceled the DG esophagus appointment and called and left Misty at PCP message of what we're doing as she's out today.

## 2015-10-23 ENCOUNTER — Ambulatory Visit (AMBULATORY_SURGERY_CENTER): Payer: Self-pay

## 2015-10-23 ENCOUNTER — Other Ambulatory Visit: Payer: Managed Care, Other (non HMO)

## 2015-10-23 VITALS — Ht 64.0 in | Wt 164.6 lb

## 2015-10-23 DIAGNOSIS — R1319 Other dysphagia: Secondary | ICD-10-CM

## 2015-10-23 DIAGNOSIS — K219 Gastro-esophageal reflux disease without esophagitis: Secondary | ICD-10-CM

## 2015-10-31 ENCOUNTER — Encounter: Payer: Self-pay | Admitting: Internal Medicine

## 2015-11-13 ENCOUNTER — Ambulatory Visit (AMBULATORY_SURGERY_CENTER): Payer: BLUE CROSS/BLUE SHIELD | Admitting: Internal Medicine

## 2015-11-13 ENCOUNTER — Encounter: Payer: Self-pay | Admitting: Internal Medicine

## 2015-11-13 VITALS — BP 130/86 | HR 92 | Temp 98.6°F | Resp 17 | Ht 64.0 in | Wt 164.0 lb

## 2015-11-13 DIAGNOSIS — R05 Cough: Secondary | ICD-10-CM

## 2015-11-13 DIAGNOSIS — R1319 Other dysphagia: Secondary | ICD-10-CM | POA: Diagnosis not present

## 2015-11-13 DIAGNOSIS — R053 Chronic cough: Secondary | ICD-10-CM

## 2015-11-13 MED ORDER — PREDNISONE 10 MG PO TABS: ORAL_TABLET | ORAL | 0 refills | Status: DC

## 2015-11-13 MED ORDER — SODIUM CHLORIDE 0.9 % IV SOLN: 500.0000 mL | INTRAVENOUS | Status: AC

## 2015-11-13 NOTE — Patient Instructions (Addendum)
Other than a small hiatal hernia (stomach slips up int chest cavity) this looks ok.  Cough can be a tricky problem to figure out and has many possible causes.  Since you have trouble with coughing during drinking we need to evaluate you with a modified barium swallow - this tells [place] how liquids and food move from oral cavity and throat into the esophagus. My staff will schedule that and let you know.  If that is unrevealing I think checking back with pulmonary and possibly ENT is sensible. We can do a study to evaluate for reflux where you where a tube in your esophagus via the nose x 24 hrs (impedance/pH) but would do other evaluation first.  Will try a short course of prednisone to see if that will help.  I appreciate the opportunity to care for you. Gatha Mayer, MD, FACG  YOU HAD AN ENDOSCOPIC PROCEDURE TODAY AT [facility]:   Refer to the procedure report that was given to you for any specific questions about what was found during the examination.  If the procedure report does not answer your questions, please call your gastroenterologist to clarify.  If you requested that your care partner not be given the details of your procedure findings, then the procedure report has been included in a sealed envelope for you to review at your convenience later.  YOU SHOULD EXPECT: Some feelings of bloating in the abdomen. Passage of more gas than usual.  Walking can help get rid of the air that was put into your GI tract during the procedure and reduce the bloating. If you had a lower endoscopy (such as a colonoscopy or flexible sigmoidoscopy) you may notice spotting of blood in your stool or on the toilet paper. If you underwent a bowel prep for your procedure, you may not have a normal bowel movement for a few days.  Please Note:  You might notice some irritation and congestion in your nose or some drainage.  This is from the oxygen used during your procedure.  There is no  need for concern and it should clear up in a day or so.  SYMPTOMS TO REPORT IMMEDIATELY:    Following upper endoscopy (EGD)  Vomiting of blood or coffee ground material  New chest pain or pain under the shoulder blades  Painful or persistently difficult swallowing  New shortness of breath  Fever of 100F or higher  Black, tarry-looking stools  For urgent or emergent issues, a gastroenterologist can be reached at any hour by calling [phone].   DIET:  We do recommend a small meal at first, but then you may proceed to your regular diet.  Drink plenty of fluids but you should avoid alcoholic beverages for 24 hours.  ACTIVITY:  You should plan to take it easy for the rest of today and you should NOT DRIVE or use heavy machinery until tomorrow (because of the sedation medicines used during the test).    FOLLOW UP: Our staff will call the number listed on your records the next business day following your procedure to check on you and address any questions or concerns that you may have regarding the information given to you following your procedure. If we do not reach you, we will leave a message.  However, if you are feeling well and you are not experiencing any problems, there is no need to return our call.  We will assume that you have returned to your regular daily activities  without incident.  If any biopsies were taken you will be contacted by phone or by letter within the next 1-3 weeks.  Please call us at [phone] if you have not heard about the biopsies in 3 weeks.    SIGNATURES/CONFIDENTIALITY: You and/or your care partner have signed paperwork which will be entered into your electronic medical record.  These signatures attest to the fact that that the information above on your After Visit Summary has been reviewed and is understood.  Full responsibility of the confidentiality of this discharge information lies with you and/or your care-partner.  Hiatal hernia-handout  given  Office will call with appointment for barium swallow.  Stay on omeprazole for now.  Start prednisone tapered dose.

## 2015-11-13 NOTE — Progress Notes (Signed)
Spontaneous respirations throughout. VSS. Resting comfortably. To PACU on room air. Report to  April RN. 

## 2015-11-13 NOTE — Op Note (Addendum)
[facility] Patient Name: Emily Holder Procedure Date: 11/13/2015 10:29 AM MRN: [id number] Endoscopist: Gatha Mayer , MD Age: 60 Referring MD:  Date of Birth: 03-02-1955 Gender: Female Account #: [id number] Procedure:                Upper GI endoscopy Indications:              Dysphagia, Chronic cough Medicines:                Monitored Anesthesia Care, Propofol per Anesthesia Procedure:                Pre-Anesthesia Assessment:                           - Prior to the procedure, a History and Physical                            was performed, and patient medications and                            allergies were reviewed. The patient's tolerance of                            previous anesthesia was also reviewed. The risks                            and benefits of the procedure and the sedation                            options and risks were discussed with the patient.                            All questions were answered, and informed consent                            was obtained. Prior Anticoagulants: The patient has                            taken no previous anticoagulant or antiplatelet                            agents. ASA Grade Assessment: II - A patient with                            mild systemic disease. After reviewing the risks                            and benefits, the patient was deemed in                            satisfactory condition to undergo the procedure.                           After obtaining informed consent, the endoscope was  passed under direct vision. Throughout the                            procedure, the patient's blood pressure, pulse, and                            oxygen saturations were monitored continuously. The                            Model GIF-HQ190 [id number]) scope was introduced                            through the mouth, and advanced to the second part                            of  duodenum. The upper GI endoscopy was                            accomplished without difficulty. The patient                            tolerated the procedure well. Scope In: Scope Out: Findings:                 The esophagus was normal.                           A 3 cm hiatal hernia was present.                           The examined duodenum was normal.                           The exam was otherwise without abnormality.                           The cardia and gastric fundus were normal on                            retroflexion. Complications:            No immediate complications. Estimated Blood Loss:     Estimated blood loss: none. Impression:               - Normal esophagus.                           - 3 cm hiatal hernia.                           - Normal examined duodenum.                           - The examination was otherwise normal.                           - No specimens collected.                           -  NO OBVIOUS CAUSE OF COUGH SEEN HERE.                           DOES HAVE A SMALL HIATAL HERNIA BUT NOT CLEAR AT                            ALL THAT IS RELATED                           HAS BEEN ON PPI AND STOPPED AND RESTARTED BUT STILL                            COUGHING.                           SAW PULMONARY IN 2009                           SAW ALLERGY ([facility]) IN PAST AND DOES HAVE PEANUT OIL                            AND [facility] Recommendation:           - Patient has a contact number available for                            emergencies. The signs and symptoms of potential                            delayed complications were discussed with the                            patient. Return to normal activities tomorrow.                            Written discharge instructions were provided to the                            patient.                           - Resume previous diet.                           - Continue present medications.                            - Perform a modified barium swallow at appointment                            to be scheduled.                           - CC; DR. Dagmar Hait                           - MAY NEED TO  SEE PULMONARY AGAIN - POSSIBLY ENT                           WILL TRY PREDNISONE BURST AND TAPER TO SEE IF THAT                            GIVES HER SOME RELIEF NOW Gatha Mayer, MD 11/13/2015 10:54:37 AM This report has been signed electronically.

## 2015-11-14 ENCOUNTER — Other Ambulatory Visit: Payer: Self-pay

## 2015-11-14 ENCOUNTER — Other Ambulatory Visit (HOSPITAL_COMMUNITY): Payer: Self-pay | Admitting: Internal Medicine

## 2015-11-14 ENCOUNTER — Telehealth: Payer: Self-pay

## 2015-11-14 DIAGNOSIS — R1310 Dysphagia, unspecified: Secondary | ICD-10-CM

## 2015-11-14 NOTE — Progress Notes (Signed)
Patient is scheduled for [facility] 11/28/15 1:00 at [facility].   Left message for patient to call back

## 2015-11-14 NOTE — Telephone Encounter (Signed)
  Follow up Call-  Call back number 11/13/2015  Post procedure Call Back phone  # [phone]  Permission to leave phone message Yes  Some recent data might be hidden     Patient questions:  Do you have a fever, pain , or abdominal swelling? No. Pain Score  0 *  Have you tolerated food without any problems? Yes.    Have you been able to return to your normal activities? Yes.    Do you have any questions about your discharge instructions: Diet   No. Medications  No. Follow up visit  No.  Do you have questions or concerns about your Care? No.  Actions: * If pain score is 4 or above: No action needed, pain <4.

## 2015-11-14 NOTE — Progress Notes (Signed)
Patient notified

## 2015-11-28 ENCOUNTER — Ambulatory Visit (HOSPITAL_COMMUNITY): Payer: BLUE CROSS/BLUE SHIELD

## 2015-12-05 ENCOUNTER — Ambulatory Visit (HOSPITAL_COMMUNITY)
Admission: RE | Admit: 2015-12-05 | Discharge: 2015-12-05 | Disposition: A | Discharge status: Home / Self Care | Payer: BLUE CROSS/BLUE SHIELD | Source: Ambulatory Visit | Attending: Internal Medicine | Admitting: Internal Medicine

## 2015-12-05 DIAGNOSIS — R05 Cough: Secondary | ICD-10-CM | POA: Diagnosis not present

## 2015-12-05 DIAGNOSIS — K219 Gastro-esophageal reflux disease without esophagitis: Secondary | ICD-10-CM | POA: Diagnosis not present

## 2015-12-05 DIAGNOSIS — R1310 Dysphagia, unspecified: Secondary | ICD-10-CM

## 2015-12-06 NOTE — Progress Notes (Signed)
This study was ok I do not have an answer for her cough issue Suggest she see pulmonary or ENT or both (again)

## 2015-12-10 ENCOUNTER — Other Ambulatory Visit: Payer: Self-pay

## 2015-12-10 DIAGNOSIS — R053 Chronic cough: Secondary | ICD-10-CM

## 2015-12-10 DIAGNOSIS — R05 Cough: Secondary | ICD-10-CM

## 2015-12-17 ENCOUNTER — Ambulatory Visit: Payer: Managed Care, Other (non HMO) | Admitting: Internal Medicine

## 2015-12-19 ENCOUNTER — Encounter: Payer: Self-pay | Admitting: Internal Medicine

## 2015-12-19 ENCOUNTER — Ambulatory Visit (INDEPENDENT_AMBULATORY_CARE_PROVIDER_SITE_OTHER): Payer: BLUE CROSS/BLUE SHIELD | Admitting: Internal Medicine

## 2015-12-19 VITALS — BP 128/68 | HR 95 | Ht 64.0 in | Wt 167.4 lb

## 2015-12-19 DIAGNOSIS — Z23 Encounter for immunization: Secondary | ICD-10-CM | POA: Diagnosis not present

## 2015-12-19 DIAGNOSIS — R059 Cough, unspecified: Secondary | ICD-10-CM

## 2015-12-19 DIAGNOSIS — R05 Cough: Secondary | ICD-10-CM

## 2015-12-19 MED ORDER — BENZONATATE 200 MG PO CAPS: 200.0000 mg | ORAL_CAPSULE | Freq: Three times a day (TID) | ORAL | 2 refills | Status: DC | PRN

## 2015-12-19 MED ORDER — FAMOTIDINE 20 MG PO TABS: ORAL_TABLET | ORAL | 11 refills | Status: DC

## 2015-12-19 MED ORDER — PREDNISONE 10 MG PO TABS
ORAL_TABLET | ORAL | 0 refills | Status: DC
Start: 2015-12-19 — End: 2016-01-21

## 2015-12-19 MED ORDER — PANTOPRAZOLE SODIUM 40 MG PO TBEC: 40.0000 mg | DELAYED_RELEASE_TABLET | Freq: Every day | ORAL | 2 refills | Status: DC

## 2015-12-19 NOTE — Patient Instructions (Addendum)
Stop respiclick for now   Prednisone 10 mg take  4 each am x 2 days,   2 each am x 2 days,  1 each am x 2 days and stop   Singulair 10 mg each pm   Tessalon 200mg  three times a day for now with goal of  100% cough suppression for now  Pantoprazole (protonix) 40 mg   Take  30-60 min before first meal of the day and Pepcid (famotidine)  20 mg one @  bedtime until after the methacholine challenge testing     GERD (REFLUX)  is an extremely common cause of respiratory symptoms just like yours , many times with no obvious heartburn at all.    It can be treated with medication, but also with lifestyle changes including elevation of the head of your bed (ideally with 6 inch  bed blocks),  Smoking cessation, avoidance of late meals, excessive alcohol, and avoid fatty foods, chocolate, peppermint, colas, red wine, and acidic juices such as orange juice.  NO MINT OR MENTHOL PRODUCTS SO NO COUGH DROPS   USE SUGARLESS CANDY INSTEAD (Jolley ranchers or Stover's or Life Savers) or even ice chips will also do - the key is to swallow to prevent all throat clearing. NO OIL BASED VITAMINS - use powdered substitutes.  Please see patient coordinator before you leave today  to schedule methacholine challenge test  In two weeks no sooner and I will call you with results

## 2015-12-19 NOTE — Progress Notes (Signed)
Subjective:    Patient ID: Emily Holder, female    DOB: [birth year],   MRN: [id number]  HPI  64 yowf  Never smoker last chemo was 1989 (per Dr Sonny Dandy x 6 months duration and none since) with new onset cough x sev months 2012 totally resolved- does not recall rx-  then recurred fall 2016 and persisted so referred to pulmonary clinic 12/19/2015 by Dr  Dagmar Hait.   12/19/2015 1st [facility] office visit/ Craige Patel   Chief Complaint  Patient presents with  . pulmonary consult    per Dr. Carlean Purl. 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 No unusual exp hx or h/o childhood pna/ asthma or knowledge of premature birth.  Sleeping ok without nocturnal  or early am exacerbation  of respiratory  c/o's or need for noct saba. Also denies any obvious fluctuation of symptoms with weather or environmental changes or other aggravating or alleviating factors except as outlined above   Current Medications, Allergies, Complete Past Medical History, Past Surgical History, Family History, and Social History were reviewed in [employer] record.  ROS  The following are not active complaints unless bolded sore throat, dysphagia, dental problems, itching, sneezing,  nasal congestion or excess/ purulent secretions, ear ache,   fever, chills, sweats, unintended wt loss, classically pleuritic or exertional cp,  orthopnea pnd or leg swelling, presyncope,  palpitations, abdominal pain, anorexia, nausea, vomiting, diarrhea  or change in bowel or bladder habits, change in stools or urine, dysuria,hematuria,  rash, arthralgias, visual complaints, headache, numbness, weakness or ataxia or problems with walking or coordination,  change in mood/affect or memory.          Review of Systems  Constitutional: Negative for fever and unexpected weight change.  HENT: Negative for congestion, dental problem, ear pain, nosebleeds, postnasal drip, rhinorrhea, sinus pressure, sneezing, sore throat and trouble swallowing.   Eyes: Negative for redness and itching.  Respiratory: Positive for cough, shortness of breath and wheezing. Negative for chest tightness.   Cardiovascular: Negative for palpitations and leg swelling.  Gastrointestinal: Negative for nausea and vomiting.  Genitourinary: Negative for dysuria.  Musculoskeletal: Negative for joint swelling.  Skin: Negative for rash.  Neurological: Negative for headaches.  Hematological: Does not bruise/bleed easily.  Psychiatric/Behavioral: Negative for dysphoric mood. The patient is not nervous/anxious.        Objective:   Physical Exam  amb wf nad  Wt Readings from Last 3 Encounters:  12/19/15 167 lb 6.4 oz (75.9 kg)  11/13/15 164 lb (74.4 kg)  10/23/15 164 lb 9.6 oz (74.7 kg)    Vital signs reviewed - Note on arrival 02 sats  97% on RA     HEENT: nl dentition, turbinates, and oropharynx. Nl external ear canals without cough reflex   NECK :  without JVD/Nodes/TM/ nl carotid upstrokes bilaterally   LUNGS: no acc muscle use,  Nl contour chest which is clear to A and P bilaterally without cough on insp or exp maneuvers   CV:  RRR  no s3 or murmur or increase in P2, no edema   ABD:  soft and nontender with nl inspiratory excursion in the supine position. No bruits or organomegaly, bowel sounds nl  MS:  Nl gait/ ext warm without deformities, calf tenderness, cyanosis or clubbing No obvious  joint restrictions   SKIN: warm and dry without lesions    NEURO:  alert, approp, nl sensorium with  no motor deficits            Assessment & Plan:

## 2015-12-20 ENCOUNTER — Telehealth: Payer: Self-pay | Admitting: *Deleted

## 2015-12-20 NOTE — Assessment & Plan Note (Addendum)
EGD 11/13/15 small HH   Max gerd rx 12/19/2015 Along with pred x 6 days and trial off dpi/saba  Methacholine challenge p 2 weeks on rx >>>  The most common causes of chronic cough in immunocompetent adults include the following: upper airway cough syndrome (UACS), previously referred to as postnasal drip syndrome (PNDS), which is caused by variety of rhinosinus conditions; (2) asthma; (3) GERD; (4) chronic bronchitis from cigarette smoking or other inhaled environmental irritants; (5) nonasthmatic eosinophilic bronchitis; and (6) bronchiectasis.   These conditions, singly or in combination, have accounted for up to 94% of the causes of chronic cough in prospective studies.   Other conditions have constituted no >6% of the causes in prospective studies These have included bronchogenic carcinoma, chronic interstitial pneumonia, sarcoidosis, left ventricular failure, ACEI-induced cough, and aspiration from a condition associated with pharyngeal dysfunction.    Chronic cough is often simultaneously caused by more than one condition. A single cause has been found from 38 to 82% of the time, multiple causes from 18 to 62%. Multiply caused cough has been the result of three diseases up to 42% of the time.         Pt reports better on saba dpi, singulair and tessilon "each about 50%" which doesn't add up since still coughing even on all 3 so the usual empirical approach to cough (management first, dx based on response) won't work here and instead  rec max rx for gerd x 2 weeks and go ahead with MCT - already had neg allergy w/u at [facility] and neg w/u for GERD per  Dr Carlean Purl so unlikely GERD or allergy are the primary driving factors.    However, Of the three most common causes of chronic cough, only one (GERD)  can actually cause the other two (asthma with false pos  methacholine)  and post nasal drip syndrome)  and perpetuate the cylce of cough inducing airway trauma, inflammation, heightened sensitivity to  reflux which is prompted by the cough itself via a cyclical mechanism.    This may partially respond to steroids and look like asthma and post nasal drainage but never erradicated completely unless the cough and the secondary reflux are eliminated, preferably both at the same time.  While not intuitively obvious, many patients with chronic low grade reflux do not cough until there is a secondary insult that disturbs the protective epithelial barrier and exposes sensitive nerve endings.  This can be viral or direct physical injury such as with an endotracheal tube.   The point is that once this occurs, it is difficult to eliminate using anything but a maximally effective acid suppression regimen at least in the short run, accompanied by an appropriate diet to address non acid GERD.   For now leave off the saba if possible pending mct and suppress cough cough around the clock tessalon to eliminate cyclical cough p 6 days of pred to eliminate any immediate concerns as to whether or not this can be asthma/cough variant or not.    Total time devoted to counseling  = 35/72m review case with pt/ discussion of options/alternatives/ personally creating written instructions  in presence of pt  then going over those specific  Instructions directly with the pt including how to use all of the meds but in particular covering each new medication in detail and the difference between the maintenance/automatic meds and the prns using an action plan format for the latter.

## 2015-12-20 NOTE — Telephone Encounter (Signed)
-----   Message from Tanda Rockers, MD sent at 12/20/2015  6:43 AM EDT ----- Forgot to put instructions:  Pantoprazole (protonix) 40 mg   Take  30-60 min before first meal of the day and Pepcid (famotidine)  20 mg one @  bedtime until after the methacholine challenge testing   meds were on list/ already sent

## 2015-12-20 NOTE — Telephone Encounter (Signed)
lmtcb

## 2015-12-24 NOTE — Telephone Encounter (Signed)
Spoke with the pt and notified of recs per MW  She verbalized understanding and nothing further needed 

## 2016-01-03 ENCOUNTER — Ambulatory Visit (HOSPITAL_COMMUNITY)
Admission: RE | Admit: 2016-01-03 | Discharge: 2016-01-03 | Disposition: A | Discharge status: Home / Self Care | Payer: BLUE CROSS/BLUE SHIELD | Source: Ambulatory Visit | Attending: Internal Medicine | Admitting: Internal Medicine

## 2016-01-03 DIAGNOSIS — R05 Cough: Secondary | ICD-10-CM

## 2016-01-03 DIAGNOSIS — R059 Cough, unspecified: Secondary | ICD-10-CM

## 2016-01-03 LAB — PULMONARY FUNCTION TEST
FEF 25-75 POST: 2.01 L/s
FEF 25-75 Pre: 1 L/sec
FEF2575-%Change-Post: 101 %
FEF2575-%Pred-Post: 91 %
FEF2575-%Pred-Pre: 45 %
FEV1-%CHANGE-POST: 22 %
FEV1-%PRED-PRE: 65 %
FEV1-%Pred-Post: 80 %
FEV1-Post: 1.9 L
FEV1-Pre: 1.54 L
FEV1FVC-%CHANGE-POST: 17 %
FEV1FVC-%Pred-Pre: 87 %
FEV6-%Change-Post: 4 %
FEV6-%PRED-PRE: 76 %
FEV6-%Pred-Post: 80 %
FEV6-PRE: 2.25 L
FEV6-Post: 2.35 L
FEV6FVC-%PRED-PRE: 104 %
FEV6FVC-%Pred-Post: 104 %
FVC-%Change-Post: 4 %
FVC-%PRED-POST: 77 %
FVC-%PRED-PRE: 74 %
FVC-POST: 2.35 L
FVC-PRE: 2.25 L
POST FEV6/FVC RATIO: 100 %
PRE FEV1/FVC RATIO: 69 %
PRE FEV6/FVC RATIO: 100 %
Post FEV1/FVC ratio: 81 %

## 2016-01-03 MED ORDER — ALBUTEROL SULFATE (2.5 MG/3ML) 0.083% IN NEBU
2.5000 mg | INHALATION_SOLUTION | Freq: Once | RESPIRATORY_TRACT | Status: AC
  Administered 2016-01-03: 2.5 mg via RESPIRATORY_TRACT

## 2016-01-03 MED ORDER — METHACHOLINE 0.0625 MG/ML NEB SOLN
2.0000 mL | Freq: Once | RESPIRATORY_TRACT | Status: AC
  Administered 2016-01-03: 0.125 mg via RESPIRATORY_TRACT
  Filled 2016-01-03: qty 2

## 2016-01-03 MED ORDER — METHACHOLINE 16 MG/ML NEB SOLN
2.0000 mL | Freq: Once | RESPIRATORY_TRACT | Status: DC
  Filled 2016-01-03: qty 2

## 2016-01-03 MED ORDER — METHACHOLINE 0.25 MG/ML NEB SOLN
2.0000 mL | Freq: Once | RESPIRATORY_TRACT | Status: DC
  Filled 2016-01-03: qty 2

## 2016-01-03 MED ORDER — METHACHOLINE 4 MG/ML NEB SOLN
2.0000 mL | Freq: Once | RESPIRATORY_TRACT | Status: DC
Start: 2016-01-03 — End: 2016-01-04
  Filled 2016-01-03: qty 2

## 2016-01-03 MED ORDER — METHACHOLINE 1 MG/ML NEB SOLN
2.0000 mL | Freq: Once | RESPIRATORY_TRACT | Status: DC
  Filled 2016-01-03: qty 2

## 2016-01-03 MED ORDER — SODIUM CHLORIDE 0.9 % IN NEBU
3.0000 mL | INHALATION_SOLUTION | Freq: Once | RESPIRATORY_TRACT | Status: AC
Start: 2016-01-03 — End: 2016-01-03
  Administered 2016-01-03: 3 mL via RESPIRATORY_TRACT
  Filled 2016-01-03: qty 3

## 2016-01-21 ENCOUNTER — Encounter: Payer: Self-pay | Admitting: Internal Medicine

## 2016-01-21 ENCOUNTER — Ambulatory Visit (INDEPENDENT_AMBULATORY_CARE_PROVIDER_SITE_OTHER): Payer: BLUE CROSS/BLUE SHIELD | Admitting: Internal Medicine

## 2016-01-21 VITALS — BP 140/88 | HR 98 | Ht 64.0 in | Wt 167.0 lb

## 2016-01-21 DIAGNOSIS — J45991 Cough variant asthma: Secondary | ICD-10-CM | POA: Diagnosis not present

## 2016-01-21 MED ORDER — MONTELUKAST SODIUM 10 MG PO TABS: ORAL_TABLET | ORAL | Status: DC

## 2016-01-21 MED ORDER — PREDNISONE 10 MG PO TABS: ORAL_TABLET | ORAL | 0 refills | Status: DC

## 2016-01-21 NOTE — Assessment & Plan Note (Signed)
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  Please see AVS for  customized  Instructions which were reviewed in detail in writing with the patient and a copy provided.

## 2016-01-21 NOTE — Patient Instructions (Addendum)
Prednisone 10 mg take  4 each am x 2 days,   2 each am x 2 days,  1 each am x 2 days and stop   Singulair(montelukast) 10 mg each pm   Please schedule a follow up office visit in 4 weeks, sooner if needed  - late add spirometry at next ov to decide qvar 40 vs dulera 100

## 2016-01-21 NOTE — Progress Notes (Signed)
Subjective:   Patient ID: Emily Holder, female    DOB: 30-Aug-1955,   MRN: [id number]     Brief patient profile:  5 yowf  Never smoker last chemo was 1989 (per Dr Sonny Dandy x 6 months duration and none since) with new onset cough x sev months 2012 totally resolved- does not recall rx but eventually stopped all cough meds -  then recurred fall 2016 and persisted so referred to pulmonary clinic 12/19/2015 by Dr  Dagmar Hait.   History of Present Illness  12/19/2015 1st [facility] office visit/ Emily Holder   Chief Complaint  Patient presents with  . pulmonary consult    per Dr. Carlean Purl. 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 and on singulair each am x aug/sept 2017 "helps some too"  Mostly sob when coughing rec  Stop respiclick for now  Prednisone 10 mg take  4 each am x 2 days,   2 each am x 2 days,  1 each am x 2 days and stop  Singulair 10 mg each pm (did not do) Tessalon 200mg  three times a day for now with goal of  100% cough suppression for now Pantoprazole (protonix) 40 mg   Take  30-60 min before first meal of the day and Pepcid (famotidine)  20 mg one @  bedtime until after the methacholine challenge testing     01/21/2016  f/u ov/Issachar Broady re: cough variant asthma only maint rx = zyrtec/ppi/h2hs  Chief Complaint  Patient presents with  . Follow-up    Cough has improved, but not resolved completely. She c/o occ wheezing.   for the first sev weeks p ov>> cough gone and did not need any tessalon at all  Started coughing with saline during the MCT and never got the methacholine part but had 22% improvement p saba  Then a few days after pfts started back with cough worse in am's and occ waking her and restarted  tessalon prn   Not limited by breathing from desired activities     No obvious day to day or daytime variability or assoc excess/ purulent sputum or mucus plugs or hemoptysis or cp or chest tightness, subjective wheeze or overt sinus or hb symptoms. No unusual exp hx or h/o childhood pna/ asthma or knowledge of premature birth.  Sleeping ok without nocturnal  or early am exacerbation  of respiratory  c/o's or need for noct saba. Also denies any obvious fluctuation of symptoms with weather or environmental changes or other aggravating or alleviating factors except as outlined above   Current Medications, Allergies, Complete Past Medical History, Past Surgical History, Family History, and Social History were reviewed in [employer] record.  ROS  The following are not active complaints unless bolded sore throat, dysphagia, dental problems, itching, sneezing,  nasal congestion or excess/ purulent secretions, ear ache,   fever, chills, sweats, unintended wt loss, classically pleuritic or exertional cp,  orthopnea pnd or leg swelling, presyncope, palpitations, abdominal pain, anorexia, nausea, vomiting, diarrhea  or change in bowel or bladder habits, change in stools or urine, dysuria,hematuria,  rash, arthralgias, visual complaints, headache, numbness, weakness or ataxia or problems with walking  or coordination,  change in mood/affect or memory.                 Objective:   Physical Exam  amb wf nad  01/21/2016        167   12/19/15 167 lb 6.4 oz (75.9 kg)  11/13/15 164 lb (74.4 kg)  10/23/15 164 lb 9.6 oz (74.7 kg)    Vital signs reviewed - Note on arrival 02 sats  96% on RA     HEENT: nl dentition, turbinates, and oropharynx. Nl external ear canals without cough reflex   NECK :  without JVD/Nodes/TM/ nl carotid upstrokes bilaterally   LUNGS: no acc muscle use,  Nl contour chest with a  few insp pops/squeaks but no exp wheeze/ cough    CV:  RRR  no s3 or  murmur or increase in P2, no edema   ABD:  soft and nontender with nl inspiratory excursion in the supine position. No bruits or organomegaly, bowel sounds nl  MS:  Nl gait/ ext warm without deformities, calf tenderness, cyanosis or clubbing No obvious joint restrictions   SKIN: warm and dry without lesions    NEURO:  alert, approp, nl sensorium with  no motor deficits            Assessment & Plan:

## 2016-02-20 ENCOUNTER — Other Ambulatory Visit: Payer: Self-pay | Admitting: Internal Medicine

## 2016-02-20 MED ORDER — PANTOPRAZOLE SODIUM 40 MG PO TBEC: 40.0000 mg | DELAYED_RELEASE_TABLET | Freq: Every day | ORAL | 0 refills | Status: AC

## 2016-02-22 ENCOUNTER — Encounter: Payer: Self-pay | Admitting: Internal Medicine

## 2016-02-22 ENCOUNTER — Ambulatory Visit (INDEPENDENT_AMBULATORY_CARE_PROVIDER_SITE_OTHER): Payer: BLUE CROSS/BLUE SHIELD | Admitting: Internal Medicine

## 2016-02-22 VITALS — BP 120/80 | HR 96 | Ht 64.0 in | Wt 169.8 lb

## 2016-02-22 DIAGNOSIS — J45991 Cough variant asthma: Secondary | ICD-10-CM | POA: Diagnosis not present

## 2016-02-22 MED ORDER — BUDESONIDE-FORMOTEROL FUMARATE 80-4.5 MCG/ACT IN AERO: 2.0000 | INHALATION_SPRAY | Freq: Two times a day (BID) | RESPIRATORY_TRACT | 11 refills | Status: DC

## 2016-02-22 MED ORDER — BUDESONIDE-FORMOTEROL FUMARATE 80-4.5 MCG/ACT IN AERO: 2.0000 | INHALATION_SPRAY | Freq: Two times a day (BID) | RESPIRATORY_TRACT | 0 refills | Status: DC

## 2016-02-22 MED ORDER — PREDNISONE 10 MG PO TABS: ORAL_TABLET | ORAL | 0 refills | Status: DC

## 2016-02-22 NOTE — Patient Instructions (Addendum)
For drainage / throat tickle try take CHLORPHENIRAMINE  4 mg - take one every 4 hours as needed - available over the counter- may cause drowsiness so start with just a bedtime dose or two and see how you tolerate it before trying in daytime    Ok to use the zyrtec during the day if needed   Prednisone 10 mg take  4 each am x 2 days,   2 each am x 2 days,  1 each am x 2 days and stop   symbicort 80 Take 2 puffs first thing in am and then another 2 puffs about 12 hours later.     No change in other medications until return in 4 weeks and we'll regroup then

## 2016-02-22 NOTE — Progress Notes (Signed)
Subjective:   Patient ID: Emily Holder, female    DOB: 16-Jan-1956    MRN: [id number]     Brief patient profile:  85 yowf  Never smoker last chemo was 1989 (per Dr Sonny Dandy x 6 months duration and none since) with new onset cough x sev months 2012 totally resolved- does not recall rx but eventually stopped all cough meds -  then recurred fall 2016 and persisted so referred to pulmonary clinic 12/19/2015 by Dr  Dagmar Hait.   History of Present Illness  12/19/2015 1st [facility] office visit/ [name]   Chief Complaint  Patient presents with  . pulmonary consult    per Dr. Carlean Purl. 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 and on singulair each am x aug/sept 2017 "helps some too"  Mostly sob when coughing rec  Stop respiclick for now  Prednisone 10 mg take  4 each am x 2 days,   2 each am x 2 days,  1 each am x 2 days and stop  Singulair 10 mg each pm (did not do) Tessalon 200mg  three times a day for now with goal of  100% cough suppression for now Pantoprazole (protonix) 40 mg   Take  30-60 min before first meal of the day and Pepcid (famotidine)  20 mg one @  bedtime until after the methacholine challenge testing     01/21/2016  f/u ov/[name] re: cough variant asthma only maint rx = zyrtec/ppi/h2hs  Chief Complaint  Patient presents with  . Follow-up    Cough has improved, but not resolved completely. She c/o occ wheezing.   for the first sev weeks p ov>> cough gone and did not need any tessalon at all  Started coughing with saline during the MCT and never got the methacholine part but had 22% improvement p saba  Then a few days after pfts started back with cough worse in am's and occ waking her and restarted  tessalon prn  rec Prednisone 10 mg take  4 each am x 2 days,   2 each am x 2 days,  1 each am x 2 days and stop  Singulair(montelukast) 10 mg each pm       02/22/2016  f/u ov/[name] re: recurrent cough since fall 2016 pred responsive  Chief Complaint  Patient presents with  . Follow-up    Cough had been better and then got worse after she was finished. She has been coughing up minimal clear sputum.    improved to where no need for tessalon p only 6 days of prednisone and maint on singulair  Sob with coughing / waking up 3am coughing  p hs pepcid/ zyrtec    Not limited by breathing from desired activities     No obvious day to day or daytime variability or assoc excess/ purulent sputum or mucus plugs or hemoptysis or cp or chest tightness, subjective wheeze or overt sinus or hb symptoms. No unusual exp hx or h/o childhood pna/ asthma or knowledge of premature birth.  Sleeping ok without nocturnal  or early am exacerbation  of respiratory  c/o's or need for noct saba. Also denies any obvious fluctuation of  symptoms with weather or environmental changes or other aggravating or alleviating factors except as outlined above   Current Medications, Allergies, Complete Past Medical History, Past Surgical History, Family History, and Social History were reviewed in [employer] record.  ROS  The following are not active complaints unless bolded sore throat, dysphagia, dental problems, itching, sneezing,  nasal congestion or excess/ purulent secretions, ear ache,   fever, chills, sweats, unintended wt loss, classically pleuritic or exertional cp,  orthopnea pnd or leg swelling, presyncope, palpitations, abdominal pain, anorexia, nausea, vomiting, diarrhea  or change in bowel or bladder habits, change in stools or urine, dysuria,hematuria,  rash, arthralgias, visual complaints, headache, numbness, weakness or ataxia or problems with walking or coordination,  change in mood/affect or  memory.                 Objective:   Physical Exam  amb wf nad   02/22/2016          169  01/21/2016        167   12/19/15 167 lb 6.4 oz (75.9 kg)  11/13/15 164 lb (74.4 kg)  10/23/15 164 lb 9.6 oz (74.7 kg)    Vital signs reviewed - Note on arrival 02 sats  97% on RA     HEENT: nl dentition, turbinates, and oropharynx. Nl external ear canals without cough reflex   NECK :  without JVD/Nodes/TM/ nl carotid upstrokes bilaterally   LUNGS: no acc muscle use,  Nl contour chest with a  no exp wheeze/ cough    CV:  RRR  no s3 or murmur or increase in P2, no edema   ABD:  soft and nontender with nl inspiratory excursion in the supine position. No bruits or organomegaly, bowel sounds nl  MS:  Nl gait/ ext warm without deformities, calf tenderness, cyanosis or clubbing No obvious joint restrictions   SKIN: warm and dry without lesions    NEURO:  alert, approp, nl sensorium with  no motor deficits            Assessment & Plan:

## 2016-02-22 NOTE — Assessment & Plan Note (Addendum)
EGD 11/13/15 small HH   Max gerd rx 12/19/2015  Along with pred x 6 days and trial off dpi/saba > resolved temporarily   Methacholine challenge p 2 weeks on rx >>> done 01/03/16 FEV1 1.54 (65%) ratio 69 with > 20% drop fev1 p saline/ improved to 1.90 with ratio 81 p 22% resp to saba but considered non-dx due to saline response    - 01/21/2016 rechallenge with singulair and max gerd rx p pred x 6 days> improved transiently/ flared on singulair > d/c 02/22/2016  - 02/22/2016  After extensive coaching HFA effectiveness =  75%  rx   symbicort 80 2bid  - Spirometry 02/22/2016   Nl but with classic f/v pattern for obstruction   Clearly this is steroid responsive and likely asthma rather than eos bronchitis as the latter does not cause reversible airflow obstruction and the former did not resp to singulair so needs trial of ICS   I had an extended discussion with the patient reviewing all relevant studies completed to date and  lasting 15 to 20 minutes of a 25 minute visit    Each maintenance medication was reviewed in detail including most importantly the difference between maintenance and prns and under what circumstances the prns are to be triggered using an action plan format that is not reflected in the computer generated alphabetically organized AVS.    Please see AVS for specific instructions unique to this visit that I personally wrote and verbalized to the the pt in detail and then reviewed with pt  by my nurse highlighting any  changes in therapy recommended at today's visit to their plan of care.

## 2016-03-21 ENCOUNTER — Ambulatory Visit (INDEPENDENT_AMBULATORY_CARE_PROVIDER_SITE_OTHER): Payer: BLUE CROSS/BLUE SHIELD | Admitting: Internal Medicine

## 2016-03-21 ENCOUNTER — Encounter: Payer: Self-pay | Admitting: Internal Medicine

## 2016-03-21 VITALS — BP 110/70 | HR 104 | Ht 63.0 in | Wt 166.0 lb

## 2016-03-21 DIAGNOSIS — R05 Cough: Secondary | ICD-10-CM | POA: Diagnosis not present

## 2016-03-21 DIAGNOSIS — J45991 Cough variant asthma: Secondary | ICD-10-CM

## 2016-03-21 DIAGNOSIS — R058 Other specified cough: Secondary | ICD-10-CM

## 2016-03-21 MED ORDER — CETIRIZINE HCL 10 MG PO CAPS: ORAL_CAPSULE | ORAL | Status: AC

## 2016-03-21 MED ORDER — BUDESONIDE-FORMOTEROL FUMARATE 80-4.5 MCG/ACT IN AERO: 2.0000 | INHALATION_SPRAY | Freq: Two times a day (BID) | RESPIRATORY_TRACT | 0 refills | Status: DC

## 2016-03-21 NOTE — Assessment & Plan Note (Signed)
Upper airway cough syndrome (previously labeled PNDS) , is  so named because it's frequently impossible to sort out how much is  CR/sinusitis with freq throat clearing (which can be related to primary GERD)   vs  causing  secondary (" extra esophageal")  GERD from wide swings in gastric pressure that occur with throat clearing, often  promoting self use of mint and menthol lozenges that reduce the lower esophageal sphincter tone and exacerbate the problem further in a cyclical fashion.   These are the same pts (now being labeled as having "irritable larynx syndrome" by some cough centers) who not infrequently have a history of having failed to tolerate ace inhibitors,  dry powder inhalers or biphosphonates or report having atypical/extraesophageal reflux symptoms that don't respond to standard doses of PPI  and are easily confused as having aecopd or asthma flares by even experienced allergists/ pulmonologists (myself included).  Still excessive throat clearing during the day only, advised hard rock candy/ continue gerd rx/ consider trial of gabapentin if persists

## 2016-03-21 NOTE — Assessment & Plan Note (Signed)
EGD 11/13/15 small HH   Max gerd rx 12/19/2015  Along with pred x 6 days and trial off dpi/saba > resolved temporarily   Methacholine challenge p 2 weeks on rx >>> done 01/03/16 FEV1 1.54 (65%) ratio 69 with > 20% drop fev1 p saline/ improved to 1.90 with ratio 81 p 22% resp to saba but considered non-dx due to saline response  - 01/21/2016 rechallenge with singulair and max gerd rx p pred x 6 days> improved transiently/ flared on singulair > d/c 02/22/2016  - 02/22/2016       symbicort 80 2bid  - Spirometry 02/22/2016   Nl but with classic f/v pattern for obstruction   - Spirometry 03/21/2016  wnl with very min curvature on symb 80 2bid and singulair  > try off singulair - 03/21/2016  After extensive coaching HFA effectiveness =    90%   All goals of chronic asthma control met including optimal function and elimination of symptoms with minimal need for rescue therapy and doubt singulair doing much here > try off but add back if flares  Contingencies discussed in full including contacting this office immediately if not controlling the symptoms using the rule of two's.      I had an extended discussion with the patient reviewing all relevant studies completed to date and  lasting 15 to 20 minutes of a 25 minute visit    Each maintenance medication was reviewed in detail including most importantly the difference between maintenance and prns and under what circumstances the prns are to be triggered using an action plan format that is not reflected in the computer generated alphabetically organized AVS.    Please see AVS for specific instructions unique to this visit that I personally wrote and verbalized to the the pt in detail and then reviewed with pt  by my nurse highlighting any  changes in therapy recommended at today's visit to their plan of care.

## 2016-03-21 NOTE — Progress Notes (Signed)
Subjective:   Patient ID: Emily Holder, female    DOB: 07-28-1955    MRN: [id number]     Brief patient profile:  8 yowf  Never smoker last chemo was 1989 (per Dr Emily Holder x 6 months duration and none since) with new onset cough x sev months 2012 totally resolved- does not recall rx but eventually stopped all cough meds -  then recurred fall 2016 and persisted so referred to pulmonary clinic 12/19/2015 by Dr  Emily Holder.   History of Present Illness  12/19/2015 1st [facility] office visit/ Emily Holder   Chief Complaint  Patient presents with  . pulmonary consult    per Dr. Carlean Holder. 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 and on singulair each am x aug/sept 2017 "helps some too"  Mostly sob when coughing rec Stop respiclick for now  Prednisone 10 mg take  4 each am x 2 days,   2 each am x 2 days,  1 each am x 2 days and stop  Singulair 10 mg each pm (did not do) Tessalon 200mg  three times a day for now with goal of  100% cough suppression for now Pantoprazole (protonix) 40 mg   Take  30-60 min before first meal of the day and Pepcid (famotidine)  20 mg one @  bedtime until after the methacholine challenge testing     01/21/2016  f/u ov/Emily Holder re: cough variant asthma only maint rx = zyrtec/ppi/h2hs  Chief Complaint  Patient presents with  . Follow-up    Cough has improved, but not resolved completely. She c/o occ wheezing.   for the first sev weeks p ov>> cough gone and did not need any tessalon at all  Started coughing with saline during the MCT and never got the methacholine part but had 22% improvement p saba  Then a few days after pfts started back with cough worse in am's and occ waking her and restarted  tessalon prn  rec Prednisone 10 mg take  4 each am x 2 days,   2 each am x 2 days,  1 each am x 2 days and stop  Singulair(montelukast) 10 mg each pm       02/22/2016  f/u ov/Emily Holder re: recurrent cough since fall 2016 pred responsive  Chief Complaint  Patient presents with  . Follow-up    Cough had been better and then got worse after she was finished. She has been coughing up minimal clear sputum.    improved to where no need for tessalon p only 6 days of prednisone and maint on singulair  Sob with coughing / waking up 3am coughing  p hs pepcid/ zyrtec  rec For drainage / throat tickle try take CHLORPHENIRAMINE  4 mg - take one every 4 hours as needed - available over the counter- may cause drowsiness so start with just a bedtime dose or two and see how you tolerate it before trying in daytime   Ok to use the zyrtec during the day if needed  Prednisone 10 mg take  4 each am x 2 days,   2 each am x 2 days,  1 each am x 2  days and stop  symbicort 80 Take 2 puffs first thing in am and then another 2 puffs about 12 hours later.      03/21/2016  f/u ov/Emily Holder re: chronic cough on singulair each pm symbicort 80 2bid/ ppi ac / zyrtec qd and h1 hs and sleeps great Chief Complaint  Patient presents with  . Follow-up    Cough has improved and is now non prod. No new co's today.   sleeping fine now with just h1 x 1 hs and reduced zyrtec to 10 mg q am - does not notice she's still clearing her throat but very happy overall "best in years"    No obvious day to day or daytime variability or assoc excess/ purulent sputum or mucus plugs or hemoptysis or cp or chest tightness, subjective wheeze or overt sinus or hb symptoms. No unusual exp hx or h/o childhood pna/ asthma or knowledge of premature birth.  Sleeping ok without nocturnal  or early am exacerbation  of respiratory  c/o's or need for noct saba. Also denies any obvious fluctuation of symptoms with weather or environmental changes or other  aggravating or alleviating factors except as outlined above   Current Medications, Allergies, Complete Past Medical History, Past Surgical History, Family History, and Social History were reviewed in [employer] record.  ROS  The following are not active complaints unless bolded sore throat, dysphagia, dental problems, itching, sneezing,  nasal congestion or excess/ purulent secretions, ear ache,   fever, chills, sweats, unintended wt loss, classically pleuritic or exertional cp,  orthopnea pnd or leg swelling, presyncope, palpitations, abdominal pain, anorexia, nausea, vomiting, diarrhea  or change in bowel or bladder habits, change in stools or urine, dysuria,hematuria,  rash, arthralgias, visual complaints, headache, numbness, weakness or ataxia or problems with walking or coordination,  change in mood/affect or memory.                 Objective:   Physical Exam  amb wf nad - occ spont throat clearing    03/21/2016          166  02/22/2016          169  01/21/2016        167   12/19/15 167 lb 6.4 oz (75.9 kg)  11/13/15 164 lb (74.4 kg)  10/23/15 164 lb 9.6 oz (74.7 kg)    Vital signs reviewed - Note on arrival 02 sats  93% on RA     HEENT: nl dentition, turbinates, and oropharynx which is pristine . Nl external ear canals without cough reflex   NECK :  without JVD/Nodes/TM/ nl carotid upstrokes bilaterally   LUNGS: no acc muscle use,  Nl contour chest with a  no exp wheeze/ cough even with fvc    CV:  RRR  no s3 or murmur or increase in P2, no edema   ABD:  soft and nontender with nl inspiratory excursion in the supine position. No bruits or organomegaly, bowel sounds nl  MS:  Nl gait/ ext warm without deformities, calf tenderness, cyanosis or clubbing No obvious joint restrictions   SKIN: warm and dry without lesions    NEURO:  alert, approp, nl sensorium with  no motor deficits            Assessment & Plan:

## 2016-03-21 NOTE — Patient Instructions (Addendum)
Stop singulair(montelukast)  and continue every thing else the same - if cough gets worse add it back   Try not to clear your throat - keep the candy handy or ice or sips of water  Please schedule a follow up office visit in 6 weeks, call sooner if needed - bring all active meds with you

## 2016-04-06 ENCOUNTER — Other Ambulatory Visit: Payer: Self-pay | Admitting: Internal Medicine

## 2016-05-02 ENCOUNTER — Ambulatory Visit: Payer: BLUE CROSS/BLUE SHIELD | Admitting: Internal Medicine

## 2016-05-13 ENCOUNTER — Ambulatory Visit: Payer: BLUE CROSS/BLUE SHIELD | Admitting: Internal Medicine

## 2016-06-12 ENCOUNTER — Ambulatory Visit: Payer: BLUE CROSS/BLUE SHIELD | Admitting: Internal Medicine

## 2017-01-28 ENCOUNTER — Other Ambulatory Visit: Payer: Self-pay | Admitting: Internal Medicine

## 2017-02-05 ENCOUNTER — Other Ambulatory Visit: Payer: Self-pay | Admitting: Internal Medicine
